# Patient Record
Sex: Female | Born: 1970 | Race: White | Hispanic: No | Smoking: Former smoker
Health system: Southern US, Community
[De-identification: ages and names within clinical notes are randomized; demographics above are authoritative.]

## PROBLEM LIST (undated history)

## (undated) DIAGNOSIS — J45909 Unspecified asthma, uncomplicated: Secondary | ICD-10-CM

## (undated) DIAGNOSIS — N19 Unspecified kidney failure: Secondary | ICD-10-CM

## (undated) DIAGNOSIS — I219 Acute myocardial infarction, unspecified: Secondary | ICD-10-CM

## (undated) DIAGNOSIS — M1990 Unspecified osteoarthritis, unspecified site: Secondary | ICD-10-CM

## (undated) DIAGNOSIS — I10 Essential (primary) hypertension: Secondary | ICD-10-CM

## (undated) DIAGNOSIS — N189 Chronic kidney disease, unspecified: Secondary | ICD-10-CM

## (undated) HISTORY — DX: Unspecified osteoarthritis, unspecified site: M19.90

## (undated) HISTORY — DX: Chronic kidney disease, unspecified: N18.9

## (undated) HISTORY — DX: Unspecified kidney failure: N19

## (undated) HISTORY — DX: Essential (primary) hypertension: I10

## (undated) HISTORY — PX: APPENDECTOMY: SHX54

## (undated) HISTORY — DX: Unspecified asthma, uncomplicated: J45.909

## (undated) HISTORY — PX: RENAL ARTERY STENT: SHX2321

## (undated) HISTORY — PX: ABDOMINAL HYSTERECTOMY: SHX81

## (undated) HISTORY — DX: Acute myocardial infarction, unspecified: I21.9

---

## 2008-05-02 ENCOUNTER — Emergency Department: Payer: Self-pay | Admitting: Emergency Medicine

## 2009-11-14 ENCOUNTER — Ambulatory Visit: Payer: Self-pay | Admitting: Family Medicine

## 2010-01-21 ENCOUNTER — Ambulatory Visit: Payer: Self-pay | Admitting: Nephrology

## 2010-03-12 ENCOUNTER — Ambulatory Visit: Payer: Self-pay | Admitting: Family Medicine

## 2010-03-18 ENCOUNTER — Ambulatory Visit: Payer: Self-pay | Admitting: Vascular Surgery

## 2010-03-20 ENCOUNTER — Ambulatory Visit: Payer: Self-pay | Admitting: Vascular Surgery

## 2010-11-04 ENCOUNTER — Emergency Department (HOSPITAL_COMMUNITY)
Admission: EM | Admit: 2010-11-04 | Discharge: 2010-11-04 | Disposition: A | Discharge status: Home / Self Care | Payer: Medicaid Other | Attending: Emergency Medicine | Admitting: Emergency Medicine

## 2010-11-04 ENCOUNTER — Emergency Department (HOSPITAL_COMMUNITY): Payer: Medicaid Other

## 2010-11-04 DIAGNOSIS — N189 Chronic kidney disease, unspecified: Secondary | ICD-10-CM | POA: Insufficient documentation

## 2010-11-04 DIAGNOSIS — E876 Hypokalemia: Secondary | ICD-10-CM | POA: Insufficient documentation

## 2010-11-04 DIAGNOSIS — I129 Hypertensive chronic kidney disease with stage 1 through stage 4 chronic kidney disease, or unspecified chronic kidney disease: Secondary | ICD-10-CM | POA: Insufficient documentation

## 2010-11-04 DIAGNOSIS — R059 Cough, unspecified: Secondary | ICD-10-CM | POA: Insufficient documentation

## 2010-11-04 DIAGNOSIS — R05 Cough: Secondary | ICD-10-CM | POA: Insufficient documentation

## 2010-11-04 DIAGNOSIS — R0602 Shortness of breath: Secondary | ICD-10-CM | POA: Insufficient documentation

## 2010-11-04 DIAGNOSIS — J45901 Unspecified asthma with (acute) exacerbation: Secondary | ICD-10-CM | POA: Insufficient documentation

## 2010-11-04 LAB — BASIC METABOLIC PANEL
GFR calc Af Amer: >60 mL/min (ref >60)
GFR calc non Af Amer: 57 mL/min — ABNORMAL LOW (ref >60)
Glucose, Bld: 108 mg/dL — ABNORMAL HIGH (ref 70–99)
Potassium: 2.9 mEq/L — ABNORMAL LOW (ref 3.5–5.1)
Sodium: 138 mEq/L (ref 135–145)

## 2010-11-04 LAB — DIFFERENTIAL
Basophils Absolute: 0 10*3/uL (ref 0.0–0.1)
Basophils Relative: 0 % (ref 0–1)
Monocytes Absolute: 0.9 10*3/uL (ref 0.1–1.0)
Neutro Abs: 6.5 10*3/uL (ref 1.7–7.7)
Neutrophils Relative %: 68 % (ref 43–77)

## 2010-11-04 LAB — CBC
Hemoglobin: 12.2 g/dL (ref 12.0–15.0)
MCHC: 33.8 g/dL (ref 30.0–36.0)
WBC: 9.6 10*3/uL (ref 4.0–10.5)

## 2011-03-03 ENCOUNTER — Emergency Department: Payer: Self-pay | Admitting: Unknown Physician Specialty

## 2012-01-22 ENCOUNTER — Observation Stay: Payer: Self-pay | Admitting: Student

## 2012-01-22 LAB — URINALYSIS, COMPLETE
Bilirubin,UR: NEGATIVE
Blood: NEGATIVE
Ketone: NEGATIVE
Ph: 6 (ref 4.5–8.0)
Specific Gravity: 1.013 (ref 1.003–1.030)
Squamous Epithelial: 4

## 2012-01-22 LAB — COMPREHENSIVE METABOLIC PANEL
Albumin: 3.7 g/dL (ref 3.4–5.0)
Alkaline Phosphatase: 94 U/L (ref 50–136)
Anion Gap: 6 — ABNORMAL LOW (ref 7–16)
BUN: 11 mg/dL (ref 7–18)
Glucose: 91 mg/dL (ref 65–99)
Potassium: 3.7 mmol/L (ref 3.5–5.1)
SGOT(AST): 22 U/L (ref 15–37)
SGPT (ALT): 18 U/L (ref 12–78)
Total Protein: 7.3 g/dL (ref 6.4–8.2)

## 2012-01-22 LAB — TROPONIN I: Troponin-I: <0.02 ng/mL

## 2012-01-22 LAB — CBC
Platelet: 214 10*3/uL (ref 150–440)
RDW: 13.9 % (ref 11.5–14.5)

## 2012-03-04 ENCOUNTER — Ambulatory Visit: Payer: Self-pay | Admitting: Vascular Surgery

## 2012-03-04 LAB — CREATININE, SERUM: Creatinine: 1.06 mg/dL (ref 0.60–1.30)

## 2012-06-21 DIAGNOSIS — I10 Essential (primary) hypertension: Secondary | ICD-10-CM | POA: Insufficient documentation

## 2012-06-21 DIAGNOSIS — J45909 Unspecified asthma, uncomplicated: Secondary | ICD-10-CM | POA: Insufficient documentation

## 2012-06-21 DIAGNOSIS — Z01818 Encounter for other preprocedural examination: Secondary | ICD-10-CM | POA: Insufficient documentation

## 2012-11-26 ENCOUNTER — Emergency Department: Payer: Self-pay | Admitting: Emergency Medicine

## 2012-11-26 LAB — CBC
HCT: 39.8 % (ref 35.0–47.0)
HGB: 13.9 g/dL (ref 12.0–16.0)
MCH: 29.5 pg (ref 26.0–34.0)
Platelet: 202 10*3/uL (ref 150–440)
RBC: 4.7 10*6/uL (ref 3.80–5.20)
WBC: 10.6 10*3/uL (ref 3.6–11.0)

## 2012-11-26 LAB — COMPREHENSIVE METABOLIC PANEL
Anion Gap: 7 (ref 7–16)
BUN: 13 mg/dL (ref 7–18)
Bilirubin,Total: 0.6 mg/dL (ref 0.2–1.0)
Co2: 28 mmol/L (ref 21–32)
Creatinine: 1.23 mg/dL (ref 0.60–1.30)
EGFR (African American): >60
Glucose: 107 mg/dL — ABNORMAL HIGH (ref 65–99)
Osmolality: 278 (ref 275–301)
SGOT(AST): 16 U/L (ref 15–37)
SGPT (ALT): 19 U/L (ref 12–78)

## 2012-11-26 LAB — URINALYSIS, COMPLETE
Bilirubin,UR: NEGATIVE
Glucose,UR: NEGATIVE mg/dL (ref 0–75)
Ketone: NEGATIVE
Leukocyte Esterase: NEGATIVE
Ph: 6 (ref 4.5–8.0)
Squamous Epithelial: 21

## 2013-07-17 ENCOUNTER — Emergency Department: Payer: Self-pay | Admitting: Emergency Medicine

## 2013-07-17 LAB — URINALYSIS, COMPLETE
Bilirubin,UR: NEGATIVE
Blood: NEGATIVE
Glucose,UR: NEGATIVE mg/dL (ref 0–75)
Ketone: NEGATIVE
Nitrite: POSITIVE
Ph: 6 (ref 4.5–8.0)
Protein: NEGATIVE
SPECIFIC GRAVITY: 1.014 (ref 1.003–1.030)
Squamous Epithelial: 2

## 2013-07-17 LAB — BASIC METABOLIC PANEL
Anion Gap: 4 — ABNORMAL LOW (ref 7–16)
BUN: 12 mg/dL (ref 7–18)
CALCIUM: 9 mg/dL (ref 8.5–10.1)
CO2: 30 mmol/L (ref 21–32)
Chloride: 106 mmol/L (ref 98–107)
Creatinine: 1.16 mg/dL (ref 0.60–1.30)
EGFR (African American): >60
GFR CALC NON AF AMER: 57 — AB
Glucose: 71 mg/dL (ref 65–99)
Osmolality: 278 (ref 275–301)
POTASSIUM: 3.4 mmol/L — AB (ref 3.5–5.1)
Sodium: 140 mmol/L (ref 136–145)

## 2013-07-17 LAB — CBC
HCT: 39.2 % (ref 35.0–47.0)
HGB: 13.2 g/dL (ref 12.0–16.0)
MCH: 30.3 pg (ref 26.0–34.0)
MCHC: 33.6 g/dL (ref 32.0–36.0)
MCV: 90 fL (ref 80–100)
PLATELETS: 158 10*3/uL (ref 150–440)
RBC: 4.36 10*6/uL (ref 3.80–5.20)
RDW: 12.9 % (ref 11.5–14.5)
WBC: 7.8 10*3/uL (ref 3.6–11.0)

## 2013-07-19 LAB — URINE CULTURE

## 2014-06-27 NOTE — Discharge Summary (Signed)
PATIENT NAME:  Jacqueline Parks, Sharron A MR#:  [id number] DATE OF BIRTH:  October 12, 1970  DATE OF ADMISSION:  01/22/2012 DATE OF DISCHARGE:  01/23/2012  PRIMARY CARE PHYSICIAN: Ulanda EdisonEmma Williams, MD   CHIEF COMPLAINT: Headache.   DISCHARGE DIAGNOSES:  1. Malignant hypertension. 2. History of occluded left renal artery stenosis and 30 to 35% right renal artery stenosis per bilateral renal artery angiogram done in January 2012.  3. Asthma, which is controlled.   DISCHARGE MEDICATIONS:  1. Coreg 25 mg 2 times a day.  2. Amlodipine 10 mg daily.  3. Hydrochlorothiazide/lisinopril 12.5 mg/20 mg 2 tabs once a day.   DISPOSITION: Home.   DIET: Low sodium.   ACTIVITY: As tolerated.   FOLLOW-UP:  1. Please follow with your primary care physician early next week for a blood pressure check.  2. Please follow with Dr. Wyn Quakerew in about two weeks for further evaluation of your renal artery stenosis. 3. If you have any further symptoms including headache, blurry vision, or any other complaints, call your primary care physician and/or 9-1-1 right away.   DISPOSITION: Home.   HISTORY OF PRESENT ILLNESS AND HOSPITAL COURSE: For full details of history and physical, please see the dictation on 01/22/2012 by Dr. Imogene Burnhen. Briefly, this is a 44 year old Caucasian female who presented with the above chief complaint. The patient stated that she had not missed any of her blood pressure medications, however, was noted to have blood pressure of 245/100 and, therefore, she presented to the hospital for the above chief complaint. She was admitted for observation on telemetry. Initial blood pressures here were 189/97 but did peak at systolic 219. She did get her outpatient medications and her blood pressure has been normalized. It has been stable overall. She had a CAT scan of the head which was negative for acute events. I discussed the case with Dr. Wyn Quakerew. The patient apparently denies following up with Dr. Wyn Quakerew after the angiogram done in  January 2012. I have strongly recommended following with Dr. Wyn Quakerew to see if there is any progression of disease in the right renal artery as the patient has a nonfunctional kidney and occluded left renal artery. Dr. Wyn Quakerew wants to see the patient in the office in about two weeks and an appointment will be made for her. At this point as her symptoms have resolved and pressures are controlled, she will be discharged with outpatient follow-up.   TOTAL TIME SPENT: 35 minutes.   CODE STATUS: The patient is FULL CODE.  ____________________________ Krystal EatonShayiq Garlen Reinig, MD sa:drc D: 01/23/2012 09:45:37 ET T: 01/23/2012 11:00:05 ET JOB#: [id number]  cc: Krystal EatonShayiq Hayleigh Bawa, MD, <Dictator> Lucillie GarfinkelEmma R. Mayford KnifeWilliams, MD Annice NeedyJason S. Dew, MD Marcelle SmilingSHAYIQ Memorial HospitalHMADZIA MD ELECTRONICALLY SIGNED 02/19/2012 11:03

## 2014-06-27 NOTE — H&P (Signed)
PATIENT NAME:  Jacqueline Parks, Jacqueline Parks MR#:  [id number] DATE OF BIRTH:  04/28/1970  DATE OF ADMISSION:  01/22/2012  PRIMARY CARE PHYSICIAN: Dr. Ulanda EdisonEmma Williams   REFERRING PHYSICIAN: Dr. Enedina FinnerGoli   CHIEF COMPLAINT: Headache today.   HISTORY OF PRESENT ILLNESS: The patient is Parks 44 year old Caucasian female with Parks history of hypertension and asthma who presented to the ED with Parks headache today and high blood pressure. The patient took her blood pressure medication this morning but developed Parks headache this afternoon and also she has blurred vision. The patient measured her blood pressure which was 245/100 so she came to the ED for further evaluation. 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   PAST MEDICAL HISTORY:  1. Hypertension. 2. Asthma.   SOCIAL HISTORY: Denies any smoking, drinking, or illicit drugs.   FAMILY HISTORY: Hypertension and also heart disease in her family. Father had Parks heart attack. Brother has heart disease, CAD, has multiple stents placed.   PAST SURGICAL HISTORY: Hysterectomy.   ALLERGIES: Proventil.   HOME MEDICATIONS:  1. HCTZ/lisinopril 12.5 mg/20 mg p.o. 2 tablets p.o. daily.  2. Coreg 25 mg p.o. b.i.d.  3. Amlodipine 10 mg p.o. daily.   REVIEW OF SYSTEMS: CONSTITUTIONAL: The patient denies any fever or chills but has Parks headache. No dizziness or weakness. Has weight loss of about 100 pounds. EYES: Has double vision and blurry vision. ENT: No epistaxis, postnasal drip, or slurred speech. CARDIOVASCULAR: No chest pain, palpitation, orthopnea, or nocturnal dyspnea. No leg edema. PULMONARY: No cough, sputum, shortness of breath, or hemoptysis. GI: No abdominal pain, nausea, vomiting, or diarrhea. No melena or bloody stool. GU: No dysuria, hematuria, or incontinence. SKIN: No rash or jaundice. NEUROLOGY: No syncope, loss of consciousness, or seizure.   PHYSICAL EXAMINATION:   VITAL SIGNS: Temperature 97.8,  blood pressure was 200/95 but now decreased to 153/86 after hydralazine and clonidine treatment.   GENERAL: The patient is alert, awake, oriented in no acute distress.   HEENT: Pupils are round, equal, reactive to light and accommodation. Moist oral mucosa. Clear oropharynx.   NECK: Supple. No JVD or carotid bruits. No lymphadenopathy. No thyromegaly.   CARDIOVASCULAR: S1, S2 regular rate, rhythm. No murmurs or gallops.   PULMONARY: Bilateral air entry. No wheezing or rales.   ABDOMEN: Soft, obese. No distention or tenderness. No organomegaly. Bowel sounds present.   EXTREMITIES: No edema, clubbing, or cyanosis. No calf tenderness. Strong bilateral pedal bodies.   SKIN: No rash or jaundice.   NEUROLOGY: Alert and oriented x3. No focal deficit. Power 5 out of 5. Sensation intact. Deep tendon reflexes 2+.    LABORATORY DATA: CBC normal with glucose 91, BUN 11, creatinine 1.0. Electrolytes normal. Troponin less than 0.02. Urinalysis negative. INR 0.9.   CAT scan of head no acute abnormality.    IMPRESSION:  1. Hypertensive malignancy.  2. Obesity.  3. Asthma.   PLAN OF TREATMENT:  1. The patient will be placed for observation with telemonitor.  2. We will give hydralazine IV p.r.n. and continue Coreg and amlodipine.  3. GI and DVT prophylaxis.   Discussed the patient's situation and plan of treatment with the patient and the patient's husband.   TIME SPENT: About 50 minutes.   ____________________________ Shaune PollackQing Avabella Wailes, MD qc:drc D: 01/22/2012 19:57:07 ET T: 01/23/2012 06:46:37 ET JOB#: [id number]  cc: Shaune PollackQing Demetri Goshert, MD, <Dictator> Lucillie GarfinkelEmma R. Mayford KnifeWilliams, MD Shaune PollackQING Zahlia Deshazer MD ELECTRONICALLY SIGNED  01/25/2012 3:25 

## 2014-06-27 NOTE — Op Note (Signed)
PATIENT NAME:  Jacqueline Parks, Jacqueline B MR#:  [id number] DATE OF BIRTH:  February 15, 1971  DATE OF PROCEDURE:  03/04/2012  PREOPERATIVE DIAGNOSES: 1. Right renal artery stenosis.  2. Left renal artery occlusion.  3. Severe poorly controlled hypertension, on multiple medications.   POSTOPERATIVE DIAGNOSES:  1. Right renal artery stenosis.  2. Left renal artery occlusion.  3. Severe poorly controlled hypertension, on multiple medications.   PROCEDURES PERFORMED: 1. Ultrasound guidance for vascular access, right femoral artery.  2. Catheter placement into right adrenal artery from right femoral approach.  3. Aortogram and selective right renal angiogram.  4. Balloon expandable stent placement, 6.5 mm diameter x 18 mm length, right renal artery, with the use of the Medtronic guard wire device.  5. StarClose closure device, right femoral artery.   SURGEON: Annice NeedyJason S. Kevontae Burgoon, M.D.   ANESTHESIA: Local with moderate conscious sedation.   ESTIMATED BLOOD LOSS: Approximately 50 mL.  CONTRAST USED: 55 mL Visipaque.   INDICATION FOR PROCEDURE: The patient is a 44 year old white female with a long history of renal artery issues. She was evaluated several years ago and was known to have a left renal artery occlusion. At that time, her right renal artery stenosis was less than 50% and it was not hemodynamically significant. However, she presents with worsening hypertension and a duplex shows progression of velocities of her right renal artery consistent with hemodynamically significant right renal artery stenosis. For these reasons, she is brought in for angiogram for further evaluation and potential treatment. The risks and benefits were discussed and informed consent was obtained.   DESCRIPTION OF PROCEDURE: The patient is brought to the vascular interventional radiology suite, groins were shaved and prepped and a sterile surgical field was created. Due to body habitus, ultrasound was used to access the right femoral  artery, which was patent and normal under ultrasound. This was done without difficulty with a Seldinger needle and permanent image was recorded. A 5 French sheath was placed. A pigtail catheter was placed in the aorta at the L1 level and AP aortogram was performed. This showed a known left renal artery occlusion and what appeared to be an approximately 80% stenosis of the right renal artery. This had progressed significantly from her evaluation several years ago and now was in a high-grade range and would benefit from treatment. The patient was given 4000 units of intravenous heparin for systemic anticoagulation as well as 12.5 grams of mannitol. A 6 French sheath was then placed and a 6 IM guide catheter was used to cannulate the right renal artery without difficulty. This was done and the lesion was easily crossed with the Medtronic guard wire. This inflated to 6 mm. The vessel was a generous size and a 6.5 mm diameter x 18 mm length balloon expandable stent was selected. This was brought across the lesion encompassing it and then going slightly back into the aorta with a tight waste taken on inflation which resolved with stent deployment. The aspiration catheter was used to aspirate 2 syringes of blood and then it was flushed copiously with heparin and saline. The balloon was then deflated. Total inflation time was approximately 5 minutes. Completion angiogram was performed through the IM guide catheter and showed the stent to be widely patent with an excellent nephrogram and markedly improved flow and at this point I elected to terminate the procedure. The guide catheter was removed. A StarClose closure device was deployed in the usual fashion with excellent hemostatic result. The patient tolerated the procedure well  and was taken to the recovery room in stable condition.  ____________________________ Annice Needy, MD jsd:sb D: 03/04/2012 09:00:50 ET T: 03/04/2012 09:30:14 ET JOB#: [id number]  cc: Annice Needy, MD, <Dictator> Lucillie Garfinkel. Mayford Knife, MD Annice Needy MD ELECTRONICALLY SIGNED 03/04/2012 16:23

## 2014-09-15 IMAGING — CT CT HEAD WITHOUT CONTRAST
1 series · 16 of 30 positions shown, 20 images · non-contrast
Comparison: none

REASON FOR EXAM: ha
COMMENTS:

PROCEDURE:     CT  - CT HEAD WITHOUT CONTRAST  - January 22, 2012  [DATE]
RESULT:     Comparison:  None
TECHNIQUE: Multiple axial images from the foramen magnum to the vertex were
obtained without IV contrast.

[Series 2: soft tissue · axial · 0.39mm/px · z∈[-165,-25]mm · 16 of 32 slices shown, 20 images]
[im 2/32  brain]
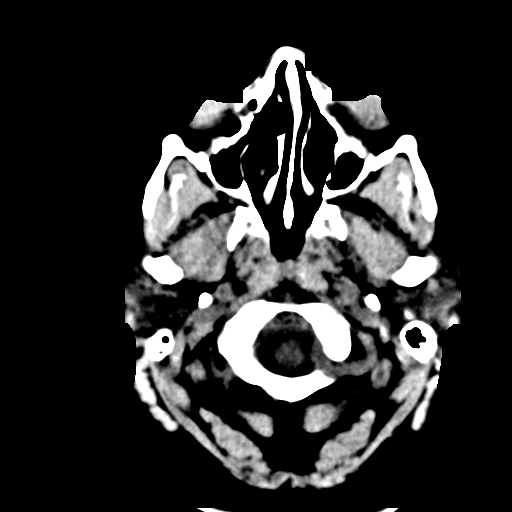
[im 2/32  bone]
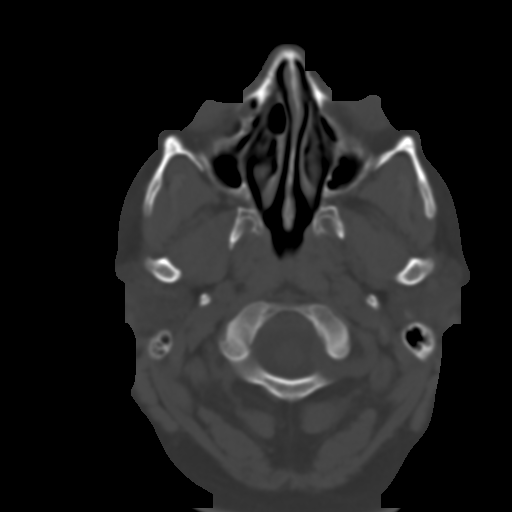
[im 4/32  brain]
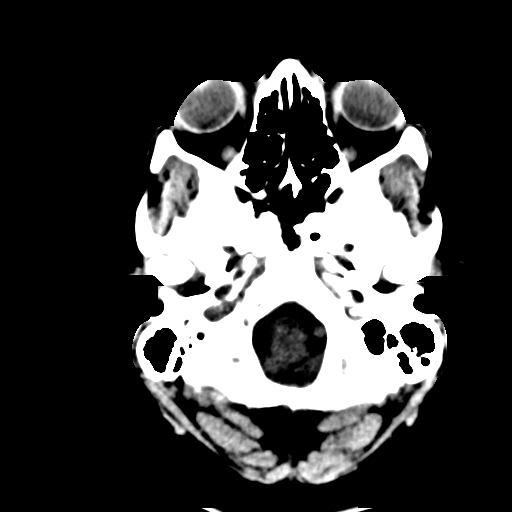
[im 6/32  brain]
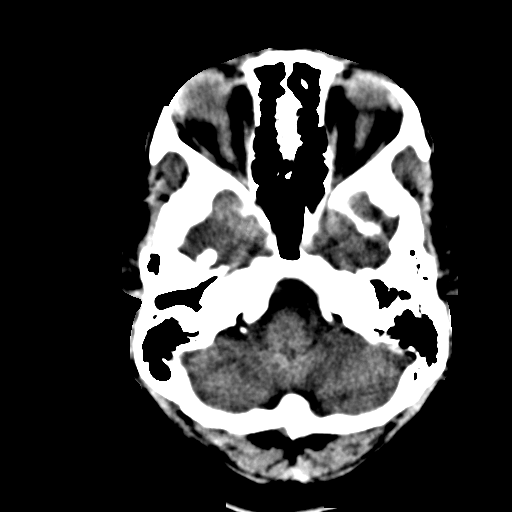
[im 8/32  brain]
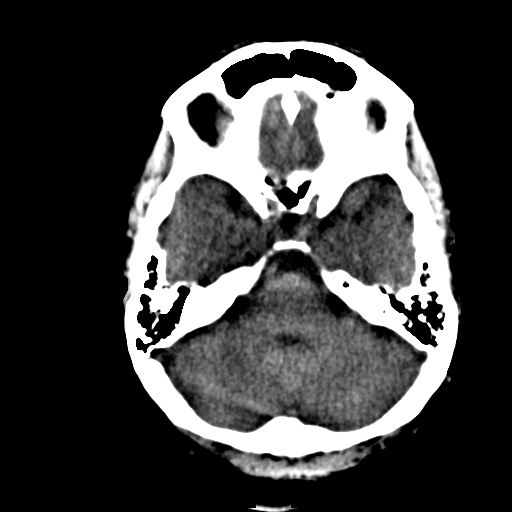
[im 9/32  brain]
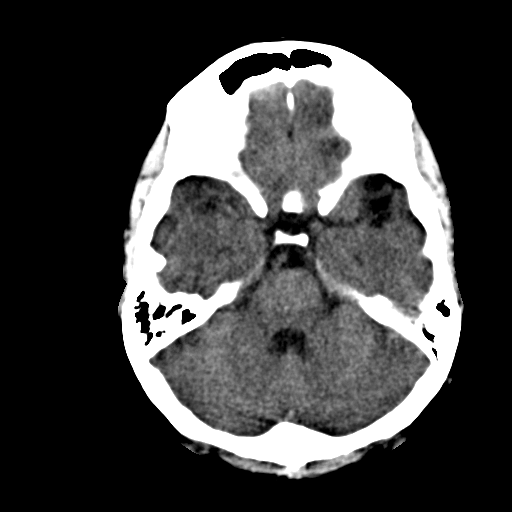
[im 9/32  bone]
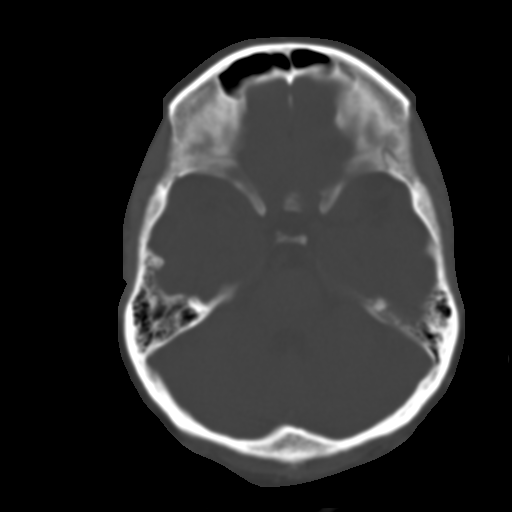
[im 11/32  brain]
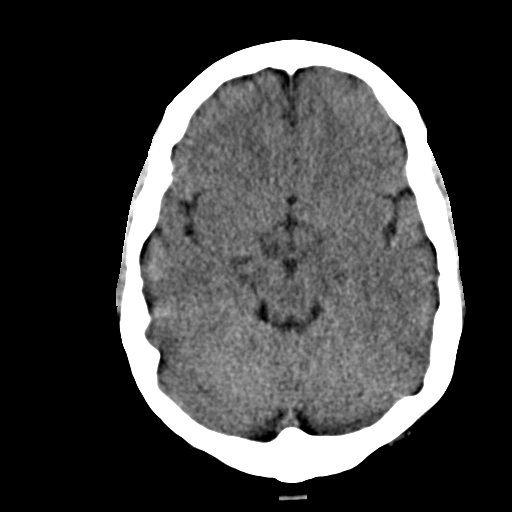
[im 13/32  brain]
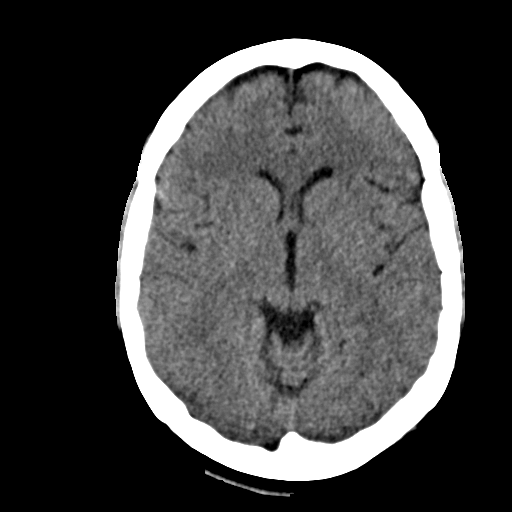
[im 15/32  brain]
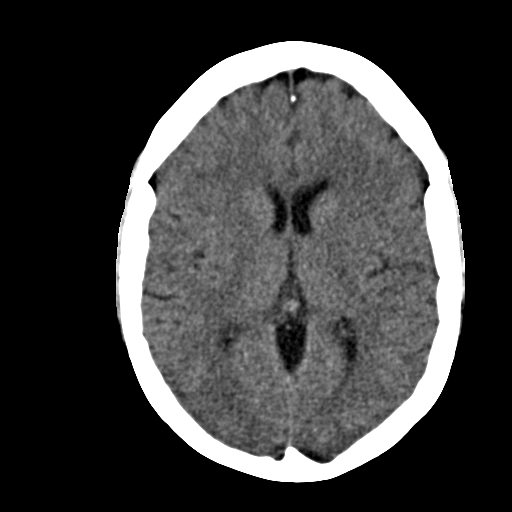
[im 17/32  brain]
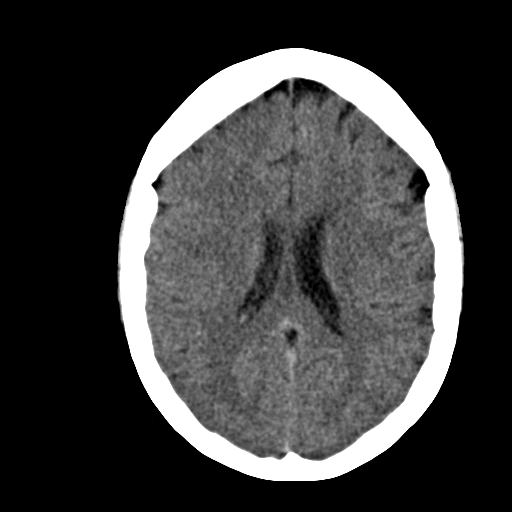
[im 17/32  bone]
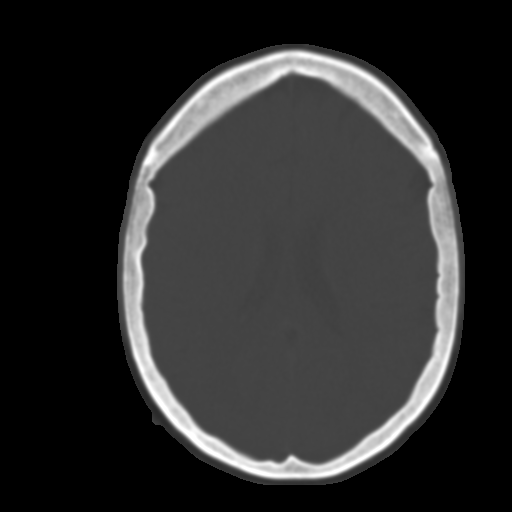
[im 19/32  brain]
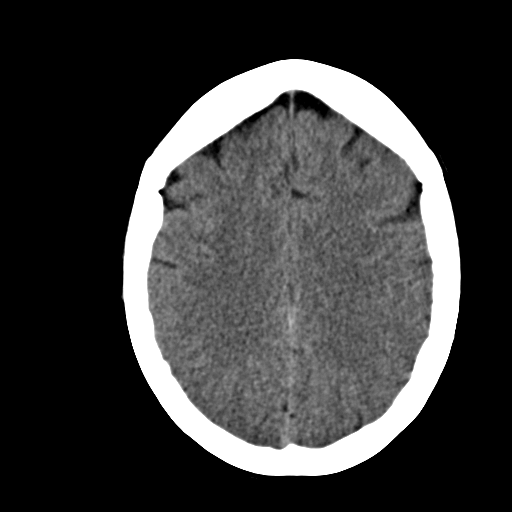
[im 21/32  brain]
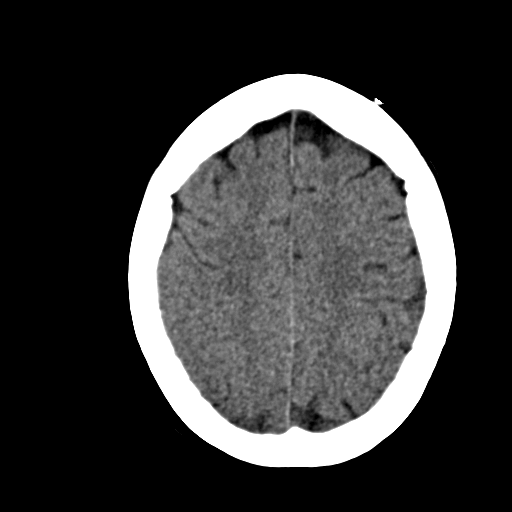
[im 23/32  brain]
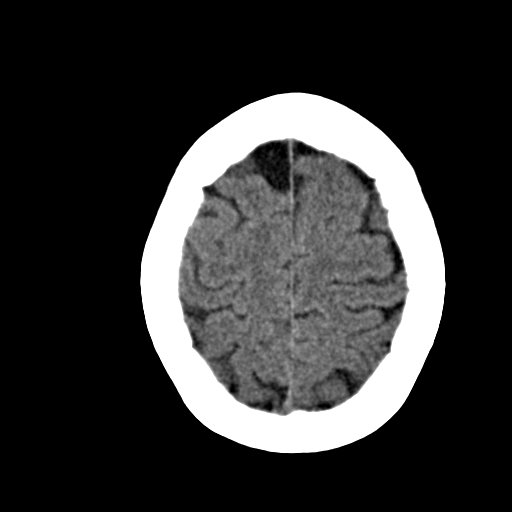
[im 24/32  brain]
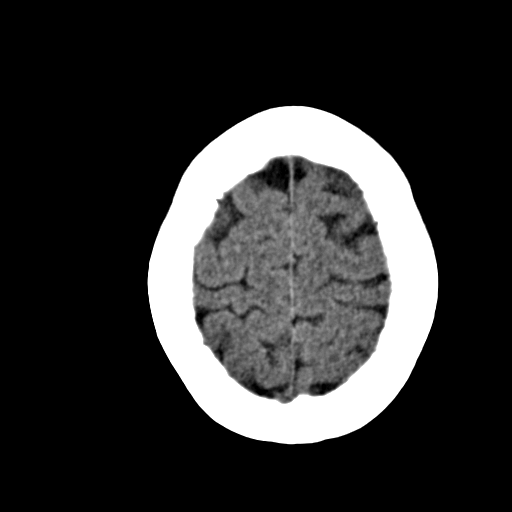
[im 24/32  bone]
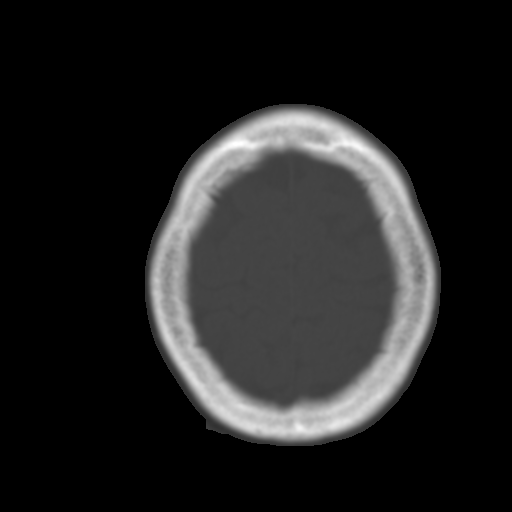
[im 26/32  brain]
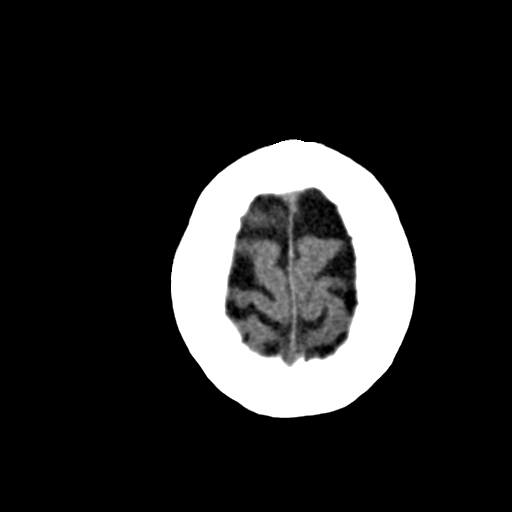
[im 28/32  brain]
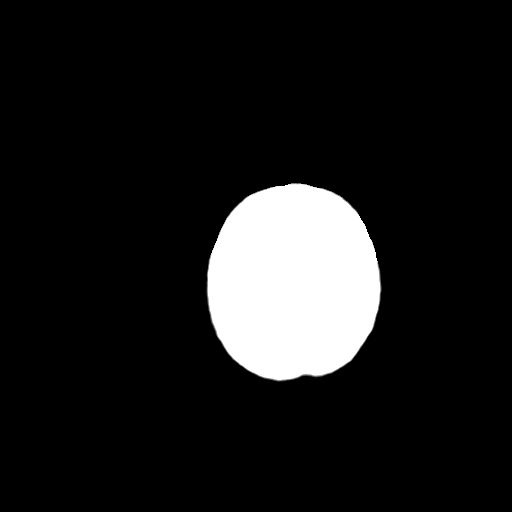
[im 30/32  brain]
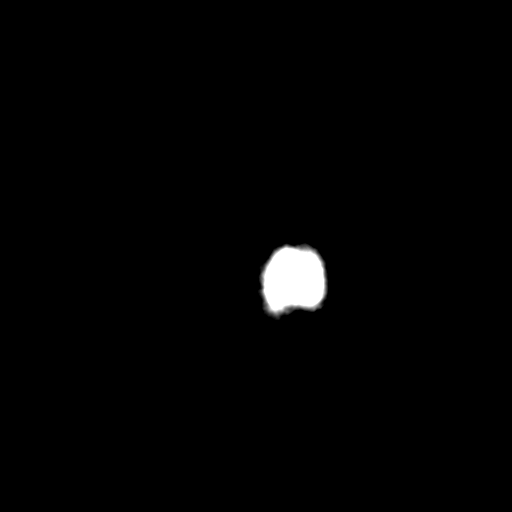

[16 of 30 positions shown; findings below may reference images not displayed]

FINDINGS: There is no evidence of mass effect, midline shift, or extra-axial fluid
collections.  There is no evidence of a space-occupying lesion or
intracranial hemorrhage. There is no evidence of a cortical-based area of
acute infarction.

The ventricles and sulci are appropriate for the patient's age. The basal
cisterns are patent.

Visualized portions of the orbits are unremarkable. The visualized portions
of the paranasal sinuses and mastoid air cells are unremarkable.

The osseous structures are unremarkable. There is a radiopaque foreign body
in the left frontal scalp.
IMPRESSION: No acute intracranial process.

[REDACTED]

## 2016-02-05 ENCOUNTER — Encounter (INDEPENDENT_AMBULATORY_CARE_PROVIDER_SITE_OTHER): Payer: Self-pay | Admitting: Vascular Surgery

## 2016-02-05 ENCOUNTER — Ambulatory Visit (INDEPENDENT_AMBULATORY_CARE_PROVIDER_SITE_OTHER): Payer: Medicaid Other | Admitting: Vascular Surgery

## 2016-02-05 VITALS — BP 118/80 | HR 72 | Resp 16 | Ht 64.0 in | Wt 346.0 lb

## 2016-02-05 DIAGNOSIS — I150 Renovascular hypertension: Secondary | ICD-10-CM | POA: Insufficient documentation

## 2016-02-05 DIAGNOSIS — N183 Chronic kidney disease, stage 3 unspecified: Secondary | ICD-10-CM

## 2016-02-05 DIAGNOSIS — I701 Atherosclerosis of renal artery: Secondary | ICD-10-CM | POA: Diagnosis not present

## 2016-02-05 NOTE — Assessment & Plan Note (Signed)
This is a significant decline in her GFR from 2 years ago when it was approximately 5667. It is now down to 47. This is a very worrisome problem given her solitary kidney with a previous intervention.

## 2016-02-05 NOTE — Progress Notes (Signed)
MRN : [id number]  Jacqueline Parks is a 45 y.o. (11/10/1970) female who presents with chief complaint of  Chief Complaint  Patient presents with  . Follow-up  .  History of Present Illness: Patient returns today in follow up of Her blood pressure control, renal function, and renal artery stenosis. She is about 4 years status post right renal artery stent placement for a solitary kidney. For several years this markedly improved her renal function and her blood pressure control. Over the past several months, both have gotten markedly worse and her GFR is now down to 47. She is also required an increase in her blood pressure regimen. She remains active. She has not had a noninvasive study of her renal arteries recently. I last saw her in 2015 when she moved away. She was seen by vascular surgeon at another institution but was very displeased with their assessment and returns for further evaluation by me.  Current Outpatient Prescriptions  Medication Sig Dispense Refill  . albuterol (PROVENTIL HFA;VENTOLIN HFA) 108 (90 Base) MCG/ACT inhaler Inhale into the lungs.    Marland Kitchen. amLODipine (NORVASC) 10 MG tablet Take 10 mg by mouth.    . carvedilol (COREG) 25 MG tablet Take 25 mg by mouth.    . Fluticasone-Salmeterol (ADVAIR) 250-50 MCG/DOSE AEPB Inhale 1 puff into the lungs 2 (two) times daily.    Marland Kitchen. lisinopril (PRINIVIL,ZESTRIL) 40 MG tablet Take 40 mg by mouth daily.    Marland Kitchen. lisinopril-hydrochlorothiazide (PRINZIDE,ZESTORETIC) 10-12.5 MG tablet Take by mouth.    . Loratadine 10 MG CAPS Take by mouth.    . spironolactone (ALDACTONE) 100 MG tablet Take 100 mg by mouth daily.     No current facility-administered medications for this visit.     Past Medical History:  Diagnosis Date  . Arthritis   . Asthma   . Chronic kidney disease   . Hypertension   . Kidney failure     Past Surgical History:  Procedure Laterality Date  . ABDOMINAL HYSTERECTOMY    . APPENDECTOMY    . CESAREAN SECTION    . RENAL  ARTERY STENT Right     Social History Social History  Substance Use Topics  . Smoking status: Former Games developermoker  . Smokeless tobacco: Never Used  . Alcohol use No  Married with 2 teenage children  Family History Family History  Problem Relation Age of Onset  . COPD Mother   . Heart disease Father    No bleeding disorders or clotting disorders  Allergies  Allergen Reactions  . Albuterol Sulfate Palpitations     REVIEW OF SYSTEMS (Negative unless checked)  Constitutional: [] Weight loss  [] Fever  [] Chills Cardiac: [] Chest pain   [] Chest pressure   [] Palpitations   [] Shortness of breath when laying flat   [] Shortness of breath at rest   [] Shortness of breath with exertion. Vascular:  [] Pain in legs with walking   [] Pain in legs at rest   [] Pain in legs when laying flat   [] Claudication   [] Pain in feet when walking  [] Pain in feet at rest  [] Pain in feet when laying flat   [] History of DVT   [] Phlebitis   [x] Swelling in legs   [] Varicose veins   [] Non-healing ulcers Pulmonary:   [] Uses home oxygen   [] Productive cough   [] Hemoptysis   [] Wheeze  [] COPD   [] Asthma Neurologic:  [] Dizziness  [] Blackouts   [] Seizures   [] History of stroke   [] History of TIA  [] Aphasia   [] Temporary blindness   []   Dysphagia   [] Weakness or numbness in arms   [] Weakness or numbness in legs Musculoskeletal:  [] Arthritis   [] Joint swelling   [] Joint pain   [] Low back pain Hematologic:  [] Easy bruising  [] Easy bleeding   [] Hypercoagulable state   [] Anemic   Gastrointestinal:  [] Blood in stool   [] Vomiting blood  [] Gastroesophageal reflux/heartburn   [] Abdominal pain Genitourinary:  [] Chronic kidney disease   [] Difficult urination  [] Frequent urination  [] Burning with urination   [] Hematuria Skin:  [] Rashes   [] Ulcers   [] Wounds Psychological:  [] History of anxiety   []  History of major depression.  Physical Examination  BP 118/80   Pulse 72   Resp 16   Ht 5\' 4"  (1.626 m)   Wt (!) 346 lb (156.9 kg)   BMI  59.39 kg/m  Gen:  WD/WN, NAD. obese Head: Lee/AT, No temporalis wasting. Ear/Nose/Throat: Hearing grossly intact, nares w/o erythema or drainage, trachea midline Eyes: Conjunctiva clear. Sclera non-icteric Neck: Supple.  No JVD.  Pulmonary:  Good air movement, no use of accessory muscles.  Cardiac: RRR, normal S1, S2 Vascular:  Vessel Right Left  Radial Palpable Palpable  Ulnar Palpable Palpable  Brachial Palpable Palpable  Carotid Palpable, without bruit Palpable, without bruit  Aorta Not palpable N/A  Femoral Palpable Palpable  Popliteal Palpable Palpable  PT Palpable Palpable  DP Palpable Palpable   Gastrointestinal: soft, non-tender/non-distended. No guarding/reflex.  Musculoskeletal: M/S 5/5 throughout.  No deformity or atrophy. 1+ BLE edema. Neurologic: Sensation grossly intact in extremities.  Symmetrical.  Speech is fluent.  Psychiatric: Judgment intact, Mood & affect appropriate for pt's clinical situation. Dermatologic: No rashes or ulcers noted.  No cellulitis or open wounds. Lymph : No Cervical, Axillary, or Inguinal lymphadenopathy.      Labs No results found for this or any previous visit (from the past 2160 hour(s)).  Radiology No results found.   Assessment/Plan  CKD (chronic kidney disease) stage 3, GFR 30-59 ml/min This is a significant decline in her GFR from 2 years ago when it was approximately 67. It is now down to 47. This is a very worrisome problem given her solitary kidney with a previous intervention.  Renovascular hypertension Her control is again significantly worsened and she is now taking 4 different medications for blood pressure including a combination agent. She reports marked worsening over the past several months. This, in addition to her renal decline makes it very suspicious that her renal artery stent to her solitary kidney and has developed restenosis. At this point, I think she needs an angiogram for full evaluation and potential  treatment. Should her stent be patent, this may actually be a worse issue and would likely lead to continued renal decline. A recurrent stenosis would at least be a treatable issue, and we will get this angiogram arranged for the next week. This is a very severe and worrisome issue with a high risk of progression to renal failure.  Renal artery stenosis (HCC) She is now 4 years status post right renal artery stent placement for high-grade stenosis with renovascular hypertension and a solitary kidney. She did well for several years. She has now developed worsening hypertension over the past several months. This in addition to her renal decline makes it very suspicious that her renal artery stent to her solitary kidney and has developed restenosis. At this point, I think she needs an angiogram for full evaluation and potential treatment. Should her stent be patent, this may actually be a worse issue and would  likely lead to continued renal decline. A recurrent stenosis would at least be a treatable issue, and we will get this angiogram arranged for the next week. This is a very severe and worrisome issue with a high risk of progression to renal failure.    Festus BarrenJason Arsenia Goracke, MD  02/05/2016 11:19 AM    This note was created with Dragon medical transcription system.  Any errors from dictation are purely unintentional

## 2016-02-05 NOTE — Assessment & Plan Note (Signed)
Her control is again significantly worsened and she is now taking 4 different medications for blood pressure including a combination agent. She reports marked worsening over the past several months. This, in addition to her renal decline makes it very suspicious that her renal artery stent to her solitary kidney and has developed restenosis. At this point, I think she needs an angiogram for full evaluation and potential treatment. Should her stent be patent, this may actually be a worse issue and would likely lead to continued renal decline. A recurrent stenosis would at least be a treatable issue, and we will get this angiogram arranged for the next week. This is a very severe and worrisome issue with a high risk of progression to renal failure.

## 2016-02-05 NOTE — Assessment & Plan Note (Signed)
She is now 4 years status post right renal artery stent placement for high-grade stenosis with renovascular hypertension and a solitary kidney. She did well for several years. She has now developed worsening hypertension over the past several months. This in addition to her renal decline makes it very suspicious that her renal artery stent to her solitary kidney and has developed restenosis. At this point, I think she needs an angiogram for full evaluation and potential treatment. Should her stent be patent, this may actually be a worse issue and would likely lead to continued renal decline. A recurrent stenosis would at least be a treatable issue, and we will get this angiogram arranged for the next week. This is a very severe and worrisome issue with a high risk of progression to renal failure.

## 2016-02-05 NOTE — Patient Instructions (Signed)
Renal Artery Stenosis Introduction Renal artery stenosis (RAS) is narrowing of the artery that carries blood to your kidneys. It can affect one or both kidneys. Your kidneys filter waste and extra fluid from your blood. You get rid of the waste and fluid when you urinate. Your kidneys also make an important chemical messenger (hormone) called renin. Renin helps regulate your blood pressure. The first sign of RAS may be high blood pressure. Over time, other symptoms can develop. What are the causes? Plaque buildup in your arteries (atherosclerosis) is the main cause of RAS. The plaques that cause this are made up of:  Fat.  Cholesterol.  Calcium.  Other substances. As these substances build up in your renal artery, this slows the blood supply to your kidneys. The lack of blood and oxygen causes the signs and symptoms of RAS. A much less common cause of RAS is a disease called fibromuscular dysplasia. This disease causes abnormal cell growth that narrows the renal artery. It is not related to atherosclerosis. It occurs mostly in women who are 25-50 years old. It may be passed down through families. What increases the risk? You may be at risk for renal artery stenosis if you:  Are a man who is at least 47 years old.  Are a woman who is at least 47 years old.  Have high blood pressure.  Have high cholesterol.  Are a smoker.  Abuse alcohol.  Have diabetes or prediabetes.  Are overweight.  Have a family history of early heart disease. What are the signs or symptoms? RAS usually develops slowly. You may not have any signs or symptoms at first. The earliest signs may be:  Developing high blood pressure.  A sudden increase in existing high blood pressure.  No longer responding to medicine that used to control your blood pressure. Later signs and symptoms are due to kidney damage. They may include:  Fatigue.  Shortness of breath.  Swollen legs and feet.  Dry  skin.  Headaches.  Muscle cramps.  Loss of appetite.  Nausea or vomiting. How is this diagnosed? Your health care provider may suspect RAS based on changes in your blood pressure and your risk factors. A physical exam will be done. Your health care provider may use a stethoscope to listen for a whooshing sound (bruit) that can occur where the renal artery is blocking blood flow. Several tests may be done to confirm a diagnosis of RAS. These may include:  Blood and urine tests to check your kidney function.  Imaging tests of your kidneys, such as:  A test that involves using sound waves to create an image of your kidneys and the blood flow to your kidneys (ultrasound).  A test in which dye is injected into one of your blood vessels so images can be taken as the dye flows through your renal arteries (angiogram). These tests can be done using X-rays, a CT scan (computed tomography angiogram, CTA), or a type of MRI (magnetic resonance angiogram, MRA). How is this treated? Making lifestyle changes to reduce your risk factors is the first treatment option for early RAS. If the blood flow to one of your kidneys is cut by more than half, you may need medicine to:  Lower your blood pressure. This is the main medical treatment for RAS. You may need more than one type of medicine for this. The two types that work best for RAS are:  ACE inhibitors.  Angiotensin receptor blockers.  Reduce fluid in the body (diuretics).    Lower your cholesterol (statins). If medicine is not enough to control RAS, you may need surgery. This may involve:  Threading a tube with an inflatable balloon into the renal artery to force it open (angioplasty).  Removing plaque from inside the artery (endarterectomy). Follow these instructions at home:  Take medicines only as directed by your health care provider.  Make any lifestyle changes recommended by your health care provider. This may include:  Working with a  dietitian to maintain a heart-healthy diet. This type of diet is low in saturated fat, salt, and added sugar.  Starting an exercise program as directed by your health care provider.  Maintaining a healthy weight.  Quitting smoking.  Not abusing alcohol.  Keep all follow-up visits as directed by your health care provider. This is important. Contact a health care provider if:  Your symptoms of RAS are not getting better.  Your symptoms are changing or getting worse. Get help right away if:  You have very bad pain in your back or abdomen.  You have blood in your urine. This information is not intended to replace advice given to you by your health care provider. Make sure you discuss any questions you have with your health care provider. Document Released: 11/20/2004 Document Revised: 08/02/2015 Document Reviewed: 06/09/2013  2017 Elsevier  

## 2016-02-06 ENCOUNTER — Other Ambulatory Visit (INDEPENDENT_AMBULATORY_CARE_PROVIDER_SITE_OTHER): Payer: Self-pay | Admitting: Vascular Surgery

## 2016-02-07 ENCOUNTER — Encounter
Admission: RE | Admit: 2016-02-07 | Discharge: 2016-02-07 | Disposition: A | Discharge status: Home / Self Care | Payer: Medicaid Other | Source: Ambulatory Visit | Attending: Vascular Surgery | Admitting: Vascular Surgery

## 2016-02-07 MED ORDER — DEXTROSE 5 % IV SOLN
1.5000 g | INTRAVENOUS | Status: AC
  Administered 2016-02-08: 1.5 g via INTRAVENOUS

## 2016-02-07 NOTE — Patient Instructions (Signed)
[facility]  Your procedure is scheduled with Dr                               On:  Date: 02/08/2016                   Time:11:00 am     On arrival [facility] Your arrival time will be approximately 1-1.5 hours prior to you procedure start time. This allows time to check lab results and prep you for the procedure.  If your procedure time change you will be notified by the doctor's office.    _x____Do not eat or drink 8 hours prior to your procedure.    __x___Take all your morning medications with sips of water.  _____Continue Plavix and/or Aspirin.  _____Take half of your bedtime Insulin.  _____Take half of  your morning Insulin.  __x___Bring your current medications in their bottle with you.  _____Do not take Metformin 24 hours before procedure or 48 hours after you procedure.  _____Stop Pradaxa, Xarelto, Coumadin, Effient, or Eliquis for 3 days prior to your procedure.  Your recovery time will be 1-2 hours (1 hour for a  vein stick, 2 hours for an artery stick )  Please call Dr Wyn Quakerew and Dr Marijean HeathSchnier's office with any questions or concerns: [phone].  You will need to have someone drive you home and stay with you the night of the procedure.

## 2016-02-08 ENCOUNTER — Encounter: Payer: Self-pay | Admitting: *Deleted

## 2016-02-08 ENCOUNTER — Encounter
Admission: RE | Disposition: A | Discharge status: Home / Self Care | Payer: Medicaid Other | Source: Ambulatory Visit | Attending: Vascular Surgery

## 2016-02-08 ENCOUNTER — Ambulatory Visit
Admission: RE | Admit: 2016-02-08 | Discharge: 2016-02-08 | Disposition: A | Discharge status: Home / Self Care | Payer: Medicaid Other | Source: Ambulatory Visit | Attending: Vascular Surgery | Admitting: Vascular Surgery

## 2016-02-08 DIAGNOSIS — Y832 Surgical operation with anastomosis, bypass or graft as the cause of abnormal reaction of the patient, or of later complication, without mention of misadventure at the time of the procedure: Secondary | ICD-10-CM | POA: Diagnosis not present

## 2016-02-08 DIAGNOSIS — Z87891 Personal history of nicotine dependence: Secondary | ICD-10-CM | POA: Diagnosis not present

## 2016-02-08 DIAGNOSIS — Z90710 Acquired absence of both cervix and uterus: Secondary | ICD-10-CM | POA: Diagnosis not present

## 2016-02-08 DIAGNOSIS — N183 Chronic kidney disease, stage 3 (moderate): Secondary | ICD-10-CM | POA: Diagnosis not present

## 2016-02-08 DIAGNOSIS — Z888 Allergy status to other drugs, medicaments and biological substances status: Secondary | ICD-10-CM | POA: Diagnosis not present

## 2016-02-08 DIAGNOSIS — I150 Renovascular hypertension: Secondary | ICD-10-CM | POA: Diagnosis not present

## 2016-02-08 DIAGNOSIS — T82858A Stenosis of vascular prosthetic devices, implants and grafts, initial encounter: Secondary | ICD-10-CM | POA: Insufficient documentation

## 2016-02-08 DIAGNOSIS — N179 Acute kidney failure, unspecified: Secondary | ICD-10-CM | POA: Insufficient documentation

## 2016-02-08 DIAGNOSIS — Z836 Family history of other diseases of the respiratory system: Secondary | ICD-10-CM | POA: Diagnosis not present

## 2016-02-08 DIAGNOSIS — Z8249 Family history of ischemic heart disease and other diseases of the circulatory system: Secondary | ICD-10-CM | POA: Diagnosis not present

## 2016-02-08 DIAGNOSIS — I701 Atherosclerosis of renal artery: Secondary | ICD-10-CM | POA: Insufficient documentation

## 2016-02-08 DIAGNOSIS — I129 Hypertensive chronic kidney disease with stage 1 through stage 4 chronic kidney disease, or unspecified chronic kidney disease: Secondary | ICD-10-CM | POA: Diagnosis not present

## 2016-02-08 HISTORY — PX: PERIPHERAL VASCULAR CATHETERIZATION: SHX172C

## 2016-02-08 LAB — CREATININE, SERUM
Creatinine, Ser: 1.36 mg/dL — ABNORMAL HIGH (ref 0.44–1.00)
GFR calc Af Amer: 53 mL/min — ABNORMAL LOW (ref >60)
GFR calc non Af Amer: 46 mL/min — ABNORMAL LOW (ref >60)

## 2016-02-08 LAB — BUN: BUN: 21 mg/dL — ABNORMAL HIGH (ref 6–20)

## 2016-02-08 SURGERY — RENAL ANGIOGRAPHY
Anesthesia: Moderate Sedation

## 2016-02-08 MED ORDER — FAMOTIDINE 20 MG PO TABS: 40.0000 mg | ORAL_TABLET | ORAL | Status: DC | PRN

## 2016-02-08 MED ORDER — IOPAMIDOL (ISOVUE-300) INJECTION 61%
INTRAVENOUS | Status: DC | PRN
  Administered 2016-02-08: 30 mL via INTRAVENOUS

## 2016-02-08 MED ORDER — FENTANYL CITRATE (PF) 100 MCG/2ML IJ SOLN
INTRAMUSCULAR | Status: AC
  Filled 2016-02-08: qty 2

## 2016-02-08 MED ORDER — SODIUM CHLORIDE 0.9 % IV SOLN: 500.0000 mL | Freq: Once | INTRAVENOUS | Status: DC | PRN

## 2016-02-08 MED ORDER — DEXTROSE 5 % IV SOLN
INTRAVENOUS | Status: AC
  Filled 2016-02-08: qty 1.5

## 2016-02-08 MED ORDER — METHYLPREDNISOLONE SODIUM SUCC 125 MG IJ SOLR: 125.0000 mg | INTRAMUSCULAR | Status: DC | PRN

## 2016-02-08 MED ORDER — ACETAMINOPHEN 325 MG PO TABS: 325.0000 mg | ORAL_TABLET | ORAL | Status: DC | PRN

## 2016-02-08 MED ORDER — HYDROMORPHONE HCL 1 MG/ML IJ SOLN: 0.5000 mg | INTRAMUSCULAR | Status: DC | PRN

## 2016-02-08 MED ORDER — ONDANSETRON HCL 4 MG/2ML IJ SOLN: 4.0000 mg | Freq: Four times a day (QID) | INTRAMUSCULAR | Status: DC | PRN

## 2016-02-08 MED ORDER — GUAIFENESIN-DM 100-10 MG/5ML PO SYRP: 15.0000 mL | ORAL_SOLUTION | ORAL | Status: DC | PRN

## 2016-02-08 MED ORDER — FENTANYL CITRATE (PF) 100 MCG/2ML IJ SOLN
INTRAMUSCULAR | Status: DC | PRN
  Administered 2016-02-08 (×2): 50 ug via INTRAVENOUS

## 2016-02-08 MED ORDER — LIDOCAINE HCL (PF) 1 % IJ SOLN
INTRAMUSCULAR | Status: AC
  Filled 2016-02-08: qty 30

## 2016-02-08 MED ORDER — CLOPIDOGREL BISULFATE 75 MG PO TABS
75.0000 mg | ORAL_TABLET | Freq: Every day | ORAL | 11 refills | Status: DC
Start: 2016-02-08 — End: 2018-06-21

## 2016-02-08 MED ORDER — HYDRALAZINE HCL 20 MG/ML IJ SOLN: 5.0000 mg | INTRAMUSCULAR | Status: DC | PRN

## 2016-02-08 MED ORDER — PHENOL 1.4 % MT LIQD: 1.0000 | OROMUCOSAL | Status: DC | PRN

## 2016-02-08 MED ORDER — HYDROMORPHONE HCL 1 MG/ML IJ SOLN: 1.0000 mg | Freq: Once | INTRAMUSCULAR | Status: DC

## 2016-02-08 MED ORDER — LABETALOL HCL 5 MG/ML IV SOLN: 10.0000 mg | INTRAVENOUS | Status: DC | PRN

## 2016-02-08 MED ORDER — MIDAZOLAM HCL 2 MG/2ML IJ SOLN
INTRAMUSCULAR | Status: AC
  Filled 2016-02-08: qty 4

## 2016-02-08 MED ORDER — HEPARIN SODIUM (PORCINE) 1000 UNIT/ML IJ SOLN
INTRAMUSCULAR | Status: DC | PRN
  Administered 2016-02-08: 5000 [IU] via INTRAVENOUS

## 2016-02-08 MED ORDER — ACETAMINOPHEN 325 MG RE SUPP
325.0000 mg | RECTAL | Status: DC | PRN
  Filled 2016-02-08: qty 2

## 2016-02-08 MED ORDER — MIDAZOLAM HCL 2 MG/2ML IJ SOLN
INTRAMUSCULAR | Status: DC | PRN
  Administered 2016-02-08 (×2): 2 mg via INTRAVENOUS

## 2016-02-08 MED ORDER — OXYCODONE-ACETAMINOPHEN 5-325 MG PO TABS: 1.0000 | ORAL_TABLET | ORAL | Status: DC | PRN

## 2016-02-08 MED ORDER — METOPROLOL TARTRATE 5 MG/5ML IV SOLN: 2.0000 mg | INTRAVENOUS | Status: DC | PRN

## 2016-02-08 MED ORDER — SODIUM CHLORIDE 0.9 % IV SOLN: INTRAVENOUS | Status: DC

## 2016-02-08 MED ORDER — HEPARIN SODIUM (PORCINE) 1000 UNIT/ML IJ SOLN
INTRAMUSCULAR | Status: AC
  Filled 2016-02-08: qty 1

## 2016-02-08 SURGICAL SUPPLY — 15 items
BALLN LUTONIX DCB 6X40X130 (BALLOONS) ×3
BALLOON LUTONIX DCB 6X40X130 (BALLOONS) ×1 IMPLANT
CANNULA 5F STIFF (CANNULA) ×3 IMPLANT
CATH C2 65CM (CATHETERS) ×3 IMPLANT
CATH PIG 70CM (CATHETERS) ×3 IMPLANT
DEVICE PRESTO INFLATION (MISCELLANEOUS) ×3 IMPLANT
DEVICE STARCLOSE SE CLOSURE (Vascular Products) ×3 IMPLANT
GLIDEWIRE ADV .035X260CM (WIRE) ×3 IMPLANT
PACK ANGIOGRAPHY (CUSTOM PROCEDURE TRAY) ×3 IMPLANT
SHEATH ANL2 6FRX45 HC (SHEATH) ×3 IMPLANT
SHEATH BRITE TIP 5FRX11 (SHEATH) ×3 IMPLANT
SYR MEDRAD MARK V 150ML (SYRINGE) ×3 IMPLANT
TUBING CONTRAST HIGH PRESS 72 (TUBING) ×3 IMPLANT
WIRE J 3MM .035X145CM (WIRE) ×3 IMPLANT
WIRE MAGIC TORQUE 260C (WIRE) ×3 IMPLANT

## 2016-02-08 NOTE — OR Nursing (Signed)
Prior to start of case Dr Wyn Quakerew notified of GFR, instructed to infuse ns at 200 ml/hr

## 2016-02-08 NOTE — Op Note (Signed)
[facility] Percutaneous Study/Intervention Procedural Note    Surgeon(s): [employer]  Assistants: none  Pre-operative Diagnosis: Recurrent right renal artery stenosis, renovascular hypertension, renal insufficiency with a solitary kidney  Post-operative diagnosis: Same  Procedure(s) Performed: 1. Ultrasound guidance for vascular access right femoral artery 2. Catheter placement into right renal artery from right femoral approach 3. Aortogram and selective right renal angiogram  4. Percutaneous transluminal angioplasty with a 6 mm diameter by 4 cm length Lutonix drug-coated balloon to the right renal artery stent 5. StarClose closure device right femoral artery  Contrast: 30 cc  EBL: 20 cc   Fluroro Time: 2.1 minutes  Moderate Conscious sedation time: Approximately 25 minutes using 4 mg of Versed and 100 mcg of Fentanyl  Indications: The patient is a 45 y.o.female with worsening severe hypertension despite multiple medications and the addition of 2 new medication since I had last seen her. In addition, her GFR had dropped about 20 points over the past year. The patient has suboptimal blood pressure control despite multiple antihypertensives and worsening renal function with a solitary kidney highly suggestive of recurrent right renal artery stenosis.  Given the clinical scenario, angiogram is indicated for further evaluation of her renal artery and potential treatment. Risks and benefits are discussed and informed consent is obtained.  Procedure: The patient was identified and appropriate procedural time out was performed. The patient was then placed supine on the table and prepped and draped in the usual sterile fashion.Moderate conscious sedation was administered with a face to face encounter with the patient throughout the procedure with my supervision of the RN administering  medicines and monitoring the patients vital signs and mental status throughout from the start of the procedure until the patient was taken to the recovery room. Ultrasound was used to evaluate the right common femoral artery. It was patent . A digital ultrasound image was acquired. A Seldinger needle was used to access the right common femoral artery under direct ultrasound guidance and a permanent image was performed. A 0.035 J wire was advanced without resistance and a 5Fr sheath was placed. Pigtail catheter was placed into the aorta at the L1 level and an AP aortogram was performed. This demonstrated that the aorta was widely patent.  No left renal artery flow. In stent stenosis in the proximal right renal artery which measured 72% by our vessel analysis software with my visual evaluation being similar. The patient was then systemically heparinized with 5000 units of intravenous heparin. I used a C2 catheter to cannulate the right renal artery and selective imaging was performed. This confirmed a moderate stenosis of the proximal portion of the stent in the right renal artery.  At this point I selected the Magic torque wire and crossed the lesion without difficulty parking the wire well into renal artery branches beyond the previously placed stent. I upsized to a 6 [place] Ansell sheath and removed the diagnostic catheter I then selected a 6 mm diameter x 4 mm length Lutonix drug-coated balloon and brought this across the lesion within and just beyond the stent.  This was inflated encompassing the lesion with its proximal extent going back into the aorta and its distal end just beyond the previous stent.  This was inflated to 12 ATM and the waist resolved.  Completion angiogram showed only about a 10-15% residual stenosis with a brisk nephrogram.   The guide catheter was removed. Oblique arteriogram was performed of the right femoral artery and StarClose closure device was  deployed in the usual fashion  with excellent hemostatic result. The patient was taken to the recovery room in stable condition having tolerated the procedure well.  Findings:  Aortogram/Renal Arteries:aorta widely patent.  No left renal artery flow. In stent stenosis in the proximal right renal artery which measured 72% by our vessel analysis software with my visual evaluation being similar.   Condition:  Stable  Complications: None   Leotis Pain 02/08/2016 1:12 PM   This note was created with Dragon Medical transcription system. Any errors in dictation are purely unintentional.'

## 2016-02-08 NOTE — H&P (Signed)
[facility] History & Physical Update  The patient was interviewed and re-examined.  The patient's previous History and Physical has been reviewed and is unchanged.  There is no change in the plan of care. We plan to proceed with the scheduled procedure.  Festus BarrenJason Aubryana Vittorio, MD  02/08/2016, 12:05 PM

## 2016-02-11 ENCOUNTER — Encounter: Payer: Self-pay | Admitting: Vascular Surgery

## 2016-03-26 ENCOUNTER — Ambulatory Visit (INDEPENDENT_AMBULATORY_CARE_PROVIDER_SITE_OTHER): Payer: Medicaid Other | Admitting: Vascular Surgery

## 2016-03-26 ENCOUNTER — Encounter (INDEPENDENT_AMBULATORY_CARE_PROVIDER_SITE_OTHER): Payer: Medicaid Other

## 2016-04-22 ENCOUNTER — Other Ambulatory Visit (INDEPENDENT_AMBULATORY_CARE_PROVIDER_SITE_OTHER): Payer: Self-pay | Admitting: Vascular Surgery

## 2016-04-22 ENCOUNTER — Ambulatory Visit (INDEPENDENT_AMBULATORY_CARE_PROVIDER_SITE_OTHER): Payer: Medicaid Other | Admitting: Vascular Surgery

## 2016-04-22 ENCOUNTER — Other Ambulatory Visit (INDEPENDENT_AMBULATORY_CARE_PROVIDER_SITE_OTHER): Payer: Medicaid Other

## 2016-04-22 ENCOUNTER — Encounter (INDEPENDENT_AMBULATORY_CARE_PROVIDER_SITE_OTHER): Payer: Self-pay | Admitting: Vascular Surgery

## 2016-04-22 VITALS — BP 141/84 | HR 51 | Resp 16 | Ht 64.0 in | Wt 347.0 lb

## 2016-04-22 DIAGNOSIS — N183 Chronic kidney disease, stage 3 unspecified: Secondary | ICD-10-CM

## 2016-04-22 DIAGNOSIS — I701 Atherosclerosis of renal artery: Secondary | ICD-10-CM

## 2016-04-22 DIAGNOSIS — Z48812 Encounter for surgical aftercare following surgery on the circulatory system: Secondary | ICD-10-CM | POA: Diagnosis not present

## 2016-04-22 DIAGNOSIS — I150 Renovascular hypertension: Secondary | ICD-10-CM

## 2016-04-22 NOTE — Progress Notes (Signed)
MRN : [id number]  Jacqueline Parks is a 46 y.o. (06/07/1970) female who presents with chief complaint of  Chief Complaint  Patient presents with  . Re-evaluation    Follow up renal ultrasound  .  History of Present Illness: Patient returns today in follow up of Renal artery stenosis and renovascular hypertension. She underwent right renal artery angioplasty for in-stent stenosis about a month ago. In 24 hours of the procedure, she artery noticed a significant difference in her blood pressure. It has been much better controlled since that time. She had no periprocedural complications. Her duplex today shows a patent right renal artery stent with normal velocities and a known left renal artery occlusion.         Current Outpatient Prescriptions  Medication Sig Dispense Refill  . albuterol (PROVENTIL HFA;VENTOLIN HFA) 108 (90 Base) MCG/ACT inhaler Inhale into the lungs.    Marland Kitchen amLODipine (NORVASC) 10 MG tablet Take 10 mg by mouth.    . carvedilol (COREG) 25 MG tablet Take 25 mg by mouth.    . Fluticasone-Salmeterol (ADVAIR) 250-50 MCG/DOSE AEPB Inhale 1 puff into the lungs 2 (two) times daily.    Marland Kitchen lisinopril (PRINIVIL,ZESTRIL) 40 MG tablet Take 40 mg by mouth daily.    Marland Kitchen lisinopril-hydrochlorothiazide (PRINZIDE,ZESTORETIC) 10-12.5 MG tablet Take by mouth.    . Loratadine 10 MG CAPS Take by mouth.    . spironolactone (ALDACTONE) 100 MG tablet Take 100 mg by mouth daily.     No current facility-administered medications for this visit.         Past Medical History:  Diagnosis Date  . Arthritis   . Asthma   . Chronic kidney disease   . Hypertension   . Kidney failure          Past Surgical History:  Procedure Laterality Date  . ABDOMINAL HYSTERECTOMY    . APPENDECTOMY    . CESAREAN SECTION    . RENAL ARTERY STENT Right     Social History     Social History  Substance Use Topics  . Smoking status: Former Research scientist (life sciences)  . Smokeless tobacco:  Never Used  . Alcohol use No  Married with 2 teenage children  Family History      Family History  Problem Relation Age of Onset  . COPD Mother   . Heart disease Father    No bleeding disorders or clotting disorders      Allergies  Allergen Reactions  . Albuterol Sulfate Palpitations     REVIEW OF SYSTEMS (Negative unless checked)  Constitutional: _0 Weight loss  _1 Fever  _2 Chills Cardiac: _3 Chest pain   _4 Chest pressure   _5 Palpitations   _6 Shortness of breath when laying flat   _7 Shortness of breath at rest   _8 Shortness of breath with exertion. Vascular:  _9 Pain in legs with walking   _10 Pain in legs at rest   _11 Pain in legs when laying flat   _12 Claudication   _13 Pain in feet when walking  _14 Pain in feet at rest  _15 Pain in feet when laying flat   _16 History of DVT   _17 Phlebitis   _18 Swelling in legs   _19 Varicose veins   _20 Non-healing ulcers Pulmonary:   _21 Uses home oxygen   _22 Productive cough   _23 Hemoptysis   _24 Wheeze  _25 COPD   _26 Asthma Neurologic:  _27 Dizziness  _28 Blackouts   _29 Seizures   _30 History of stroke   _31 History of TIA  _32 Aphasia   _33 Temporary blindness   _34 Dysphagia   _35 Weakness or numbness in arms   _36   Weakness or numbness in legs Musculoskeletal:  _0 Arthritis   _1 Joint swelling   _2 Joint pain   _3 Low back pain Hematologic:  _4 Easy bruising  _5 Easy bleeding   _6 Hypercoagulable state   _7 Anemic   Gastrointestinal:  _8 Blood in stool   _9 Vomiting blood  _10 Gastroesophageal reflux/heartburn   _11 Abdominal pain Genitourinary:  _12 Chronic kidney disease   _13 Difficult urination  _14 Frequent urination  _15 Burning with urination   _16 Hematuria Skin:  _17 Rashes   _18 Ulcers   _19 Wounds Psychological:  _20 History of anxiety   _21  History of major depression.  Physical Examination  BP 118/80   Pulse 72   Resp 16   Ht _22  (1.626 m)   Wt (!) 346 lb (156.9 kg)   BMI 59.39 kg/m  Gen:  WD/WN, NAD. obese Head: [place]/AT, No temporalis wasting. Ear/Nose/Throat: Hearing grossly intact,  nares w/o erythema or drainage, trachea midline Eyes: Conjunctiva clear. Sclera non-icteric Neck: Supple.  No JVD.  Pulmonary:  Good air movement, no use of accessory muscles.  Cardiac: RRR, normal S1, S2 Vascular:  Vessel Right Left  Radial Palpable Palpable  Ulnar Palpable Palpable  Brachial Palpable Palpable  Carotid Palpable, without bruit Palpable, without bruit  Aorta Not palpable N/A  Femoral Palpable Palpable  Popliteal Palpable Palpable  PT Palpable Palpable  DP Palpable Palpable   Gastrointestinal: soft, non-tender/non-distended. No guarding/reflex.  Musculoskeletal: M/S 5/5 throughout.  No deformity or atrophy. 1+ BLE edema. Neurologic: Sensation grossly intact in extremities.  Symmetrical.  Speech is fluent.  Psychiatric: Judgment intact, Mood & affect appropriate for pt's clinical situation. Dermatologic: No rashes or ulcers noted.  No cellulitis or open wounds. Lymph : No Cervical, Axillary, or Inguinal lymphadenopathy.      Labs Recent Results (from the past 2160 hour(s))  BUN     Status: Abnormal   Collection Time: 02/08/16 11:20 AM  Result Value Ref Range   BUN 21 (H) 6 - 20 mg/dL  Creatinine, serum     Status: Abnormal   Collection Time: 02/08/16 11:20 AM  Result Value Ref Range   Creatinine, Ser 1.36 (H) 0.44 - 1.00 mg/dL   GFR calc non Af Amer 46 (L) >60 mL/min   GFR calc Af Amer 53 (L) >60 mL/min    Comment: (NOTE) The eGFR has been calculated using the CKD EPI equation. This calculation has not been validated in all clinical situations. eGFR's persistently <60 mL/min signify possible Chronic Kidney Disease.     Radiology No results found.    Assessment/Plan CKD (chronic kidney disease) stage 3, GFR 30-59 ml/min This is a significant decline in her GFR from 2 years ago when it was approximately 67. It is now down to 47. This is a very worrisome problem given her solitary kidney with a previous intervention.  Renovascular  hypertension She underwent right renal artery angioplasty for in-stent stenosis about a month ago. In 24 hours of the procedure, she artery noticed a significant difference in her blood pressure. It has been much better controlled since that time. She had no periprocedural complications. Her duplex today shows a patent right renal artery stent with normal velocities and a known left renal artery occlusion. I will defer further management of her antihypertensives to her primary care physician.    Renal artery stenosis (HCC) She underwent right renal artery angioplasty for in-stent stenosis about a month ago. In 24 hours of the procedure, she artery noticed a significant difference in her blood pressure. It has been much better controlled since that time. She had  no periprocedural complications. Her duplex today shows a patent right renal artery stent with normal velocities and a known left renal artery occlusion. This is a marked improvement. I will plan to recheck her in about 3-4 months with a duplex for follow-up.    No problem-specific Assessment & Plan notes found for this encounter.    Leotis Pain, MD  04/22/2016 8:51 AM    This note was created with Dragon medical transcription system.  Any errors from dictation are purely unintentional

## 2016-08-19 ENCOUNTER — Ambulatory Visit (INDEPENDENT_AMBULATORY_CARE_PROVIDER_SITE_OTHER): Payer: Medicaid Other | Admitting: Vascular Surgery

## 2016-08-19 ENCOUNTER — Encounter (INDEPENDENT_AMBULATORY_CARE_PROVIDER_SITE_OTHER): Payer: Medicaid Other

## 2016-10-10 ENCOUNTER — Ambulatory Visit (INDEPENDENT_AMBULATORY_CARE_PROVIDER_SITE_OTHER): Payer: Medicaid Other | Admitting: Vascular Surgery

## 2016-10-10 ENCOUNTER — Encounter (INDEPENDENT_AMBULATORY_CARE_PROVIDER_SITE_OTHER): Payer: Medicaid Other

## 2016-12-05 ENCOUNTER — Encounter (INDEPENDENT_AMBULATORY_CARE_PROVIDER_SITE_OTHER): Payer: Medicaid Other

## 2016-12-05 ENCOUNTER — Ambulatory Visit (INDEPENDENT_AMBULATORY_CARE_PROVIDER_SITE_OTHER): Payer: Medicaid Other | Admitting: Vascular Surgery

## 2017-01-02 ENCOUNTER — Ambulatory Visit (INDEPENDENT_AMBULATORY_CARE_PROVIDER_SITE_OTHER): Payer: Medicaid Other | Admitting: Vascular Surgery

## 2017-01-02 ENCOUNTER — Encounter (INDEPENDENT_AMBULATORY_CARE_PROVIDER_SITE_OTHER): Payer: Medicaid Other

## 2017-01-11 DIAGNOSIS — I214 Non-ST elevation (NSTEMI) myocardial infarction: Secondary | ICD-10-CM | POA: Insufficient documentation

## 2017-01-12 DIAGNOSIS — Z90710 Acquired absence of both cervix and uterus: Secondary | ICD-10-CM | POA: Insufficient documentation

## 2017-01-12 DIAGNOSIS — I160 Hypertensive urgency: Secondary | ICD-10-CM | POA: Insufficient documentation

## 2017-02-09 DIAGNOSIS — N179 Acute kidney failure, unspecified: Secondary | ICD-10-CM | POA: Insufficient documentation

## 2017-02-10 ENCOUNTER — Ambulatory Visit (INDEPENDENT_AMBULATORY_CARE_PROVIDER_SITE_OTHER): Payer: Medicaid Other | Admitting: Vascular Surgery

## 2017-02-16 ENCOUNTER — Ambulatory Visit (INDEPENDENT_AMBULATORY_CARE_PROVIDER_SITE_OTHER): Payer: Medicaid Other | Admitting: Vascular Surgery

## 2017-02-17 ENCOUNTER — Ambulatory Visit (INDEPENDENT_AMBULATORY_CARE_PROVIDER_SITE_OTHER): Payer: Medicaid Other | Admitting: Vascular Surgery

## 2017-02-18 ENCOUNTER — Encounter (INDEPENDENT_AMBULATORY_CARE_PROVIDER_SITE_OTHER): Payer: Self-pay | Admitting: Vascular Surgery

## 2017-02-18 ENCOUNTER — Ambulatory Visit (INDEPENDENT_AMBULATORY_CARE_PROVIDER_SITE_OTHER): Payer: Medicaid Other | Admitting: Vascular Surgery

## 2017-02-18 VITALS — BP 135/77 | HR 70 | Resp 18 | Ht 65.0 in | Wt 349.0 lb

## 2017-02-18 DIAGNOSIS — I701 Atherosclerosis of renal artery: Secondary | ICD-10-CM | POA: Diagnosis not present

## 2017-02-18 DIAGNOSIS — N183 Chronic kidney disease, stage 3 unspecified: Secondary | ICD-10-CM

## 2017-02-18 DIAGNOSIS — E785 Hyperlipidemia, unspecified: Secondary | ICD-10-CM | POA: Diagnosis not present

## 2017-02-18 DIAGNOSIS — I150 Renovascular hypertension: Secondary | ICD-10-CM

## 2017-02-18 NOTE — Progress Notes (Signed)
Subjective:    Patient ID: Jacqueline Parks, female    DOB: [birth year], 46 y.o.   MRN: [id number] Chief Complaint  Patient presents with  . Follow-up    3-4 month Renal   The patient presents to discuss renal artery stenosis.  The patient was last seen in February 2018 after undergoing a right renal artery angiogram for restenosis of her right renal artery stent which was placed in 2013.  The patient was scheduled to undergo a renal artery duplex however her insurance required a medical visit before approving the ultrasound the patient endorses experiencing an MI on January 09, 2017.  She was treated at [facility] for this.  Aspirin and atorvastatin were added to her medications.  The patient continues to take multiple antihypertensive medications (four total) with a diuretic to achieve blood pressures of 115-140 systolic.  Since the patient's MI she has been keeping a blood pressure log which I reviewed.  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  There is no recent kidney function and epic and patient is unaware of her most recent renal panel.  Patient denies any fever, nausea vomiting.   Review of Systems  Constitutional: Negative.   HENT: Negative.   Eyes: Negative.   Respiratory: Negative.   Cardiovascular:       MI (01/2017)  Gastrointestinal: Negative.   Endocrine: Negative.   Genitourinary:       Chronic renal insufficiency  Musculoskeletal: Negative.   Skin: Negative.   Allergic/Immunologic: Negative.   Neurological: Negative.   Hematological: Negative.   Psychiatric/Behavioral: Negative.       Objective:   Physical Exam  Constitutional: She is oriented to person, place, and time. She appears well-developed and well-nourished. No distress.  HENT:  Head: Normocephalic and atraumatic.  Eyes: Conjunctivae are normal. Pupils are equal, round, and reactive to light.  Neck: Normal range of motion.    Cardiovascular: Normal rate, regular rhythm, normal heart sounds and intact distal pulses.  Pulses:      Radial pulses are 2+ on the right side, and 2+ on the left side.       Dorsalis pedis pulses are 2+ on the right side, and 2+ on the left side.       Posterior tibial pulses are 2+ on the right side, and 2+ on the left side.  Pulmonary/Chest: Effort normal and breath sounds normal.  Musculoskeletal: Normal range of motion. She exhibits edema (Mild bilateral lower extremity edema noted).  Neurological: She is alert and oriented to person, place, and time.  Skin: Skin is warm and dry. She is not diaphoretic.  Psychiatric: She has a normal mood and affect. Her behavior is normal. Judgment and thought content normal.  Vitals reviewed.  BP 135/77 (BP Location: Right Arm, Patient Position: Sitting)   Pulse 70   Resp 18   Ht 5\' 5"  (1.651 m)   Wt (!) 349 lb (158.3 kg)   BMI 58.08 kg/m   Past Medical History:  Diagnosis Date  . Arthritis   . Asthma   . Chronic kidney disease   . Hypertension   . Kidney failure    Social History   Socioeconomic History  . Marital status: Married    Spouse name: Not on file  . Number of children: Not on file  . Years of education: Not on file  . Highest education level: Not on file  Social Needs  . Financial  resource strain: Not on file  . Food insecurity - worry: Not on file  . Food insecurity - inability: Not on file  . Transportation needs - medical: Not on file  . Transportation needs - non-medical: Not on file  Occupational History  . Not on file  Tobacco Use  . Smoking status: Former Games developermoker  . Smokeless tobacco: Never Used  Substance and Sexual Activity  . Alcohol use: No  . Drug use: No  . Sexual activity: Not on file  Other Topics Concern  . Not on file  Social History Narrative  . Not on file   Past Surgical History:  Procedure Laterality Date  . ABDOMINAL HYSTERECTOMY    . APPENDECTOMY    . CESAREAN SECTION    .  PERIPHERAL VASCULAR CATHETERIZATION N/A 02/08/2016   Procedure: Renal Angiography;  Surgeon: Annice NeedyJason S Dew, MD;  Location: [facility] INVASIVE CV LAB;  Service: Cardiovascular;  Laterality: N/A;  . RENAL ARTERY STENT Right    Family History  Problem Relation Age of Onset  . COPD Mother   . Heart disease Father    Allergies  Allergen Reactions  . Proventil [Albuterol] Palpitations and Other (See Comments)    Tolerates PROAIR Inhaler (patient is allergic to BRAND NAME PROVENTIL DUE TO BLEND)  . Soap Rash    Dial soap causes a blistery rash.      Assessment & Plan:  The patient presents to discuss renal artery stenosis.  The patient was last seen in February 2018 after undergoing a right renal artery angiogram for restenosis of her right renal artery stent which was placed in 2013.  The patient was scheduled to undergo a renal artery duplex however her insurance required a medical visit before approving the ultrasound the patient endorses experiencing an MI on January 09, 2017.  [facility].  Aspirin and atorvastatin were added to her medications.  The patient continues to take multiple antihypertensive medications (four total) with a diuretic to achieve blood pressures of 115-140 systolic.  Since the patient's MI she has been keeping a blood pressure log which I reviewed.  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  There is no recent kidney function and epic and patient is unaware of her most recent renal panel.  Patient denies any fever, nausea vomiting.  1. Renovascular hypertension - Stable As below  2. 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  This was treated end of 2017 for restenosis of the stent. 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  - VAS US RENAL ARTERY DUPLEX; Future  3. CKD (chronic kidney disease) stage 3, GFR 30-59 ml/min (HCC) - Stable There are no new renal panel results in epic The patient is unaware of her most recent renal function Patient does have a past medical history of chronic renal disease complicated by an occluded left renal artery and stenotic right renal artery  4. Hyperlipidemia, unspecified hyperlipidemia type - Stable Encouraged good control as its slows the progression of atherosclerotic disease  Current Outpatient Medications on File Prior to Visit  Medication Sig Dispense Refill  . acetaminophen (TYLENOL) 500 MG tablet Take by mouth.    Marland Kitchen. amLODipine (NORVASC) 10 MG tablet Take 10 mg by mouth daily. In am.    . aspirin EC 81 MG tablet Take by mouth.    Marland Kitchen. atorvastatin (LIPITOR) 80 MG tablet Take by mouth.    . carvedilol (COREG) 25 MG tablet Take 25 mg by mouth 2 (two) times daily.     . chlorthalidone (HYGROTON) 25 MG tablet Take 25 mg by mouth daily.  1  . clopidogrel (PLAVIX) 75 MG tablet Take 1 tablet (75 mg total) by mouth daily. 30 tablet 11  . diclofenac sodium (VOLTAREN) 1 % GEL Apply topically.    . Fluticasone-Salmeterol (ADVAIR) 250-50 MCG/DOSE AEPB Inhale 1 puff into the lungs 2 (two) times daily.    . hydrALAZINE (APRESOLINE) 25 MG tablet Take 25 mg by mouth 2 (two) times daily.  2  . lisinopril (PRINIVIL,ZESTRIL) 40 MG tablet Take 40 mg by mouth every evening.     . loratadine (CLARITIN) 10 MG tablet Take 10 mg by mouth daily. In am.  11  . nitroGLYCERIN (NITROSTAT) 0.4 MG SL tablet PLACE 1 TABLET UNDER TONGUE EVERY 5 MINUTES AS  NEEDED FOR CHEST PAIN. MAY REPEAT UP TO 3 TIMES THEN CALL 911.  1  . omeprazole (PRILOSEC) 40 MG  capsule Take by mouth.    Marland Kitchen. PROAIR HFA 108 (90 Base) MCG/ACT inhaler Inhale 1-2 puffs into the lungs every 6 (six) hours as needed for wheezing or shortness of breath.    . spironolactone (ALDACTONE) 100 MG tablet Take 100 mg by mouth daily. In am.    . Vitamin D, Ergocalciferol, (DRISDOL) 50000 units CAPS capsule Take by mouth.     No current facility-administered medications on file prior to visit.    There are no Patient Instructions on file for this visit. No Follow-up on file.  KIMBERLY A STEGMAYER, PA-C

## 2017-03-11 ENCOUNTER — Encounter (INDEPENDENT_AMBULATORY_CARE_PROVIDER_SITE_OTHER): Payer: Medicaid Other

## 2017-03-11 ENCOUNTER — Ambulatory Visit (INDEPENDENT_AMBULATORY_CARE_PROVIDER_SITE_OTHER): Payer: Medicaid Other | Admitting: Vascular Surgery

## 2017-03-20 ENCOUNTER — Encounter (INDEPENDENT_AMBULATORY_CARE_PROVIDER_SITE_OTHER): Payer: Self-pay | Admitting: Vascular Surgery

## 2017-03-20 ENCOUNTER — Ambulatory Visit (INDEPENDENT_AMBULATORY_CARE_PROVIDER_SITE_OTHER): Payer: Medicaid Other | Admitting: Vascular Surgery

## 2017-03-20 ENCOUNTER — Ambulatory Visit (INDEPENDENT_AMBULATORY_CARE_PROVIDER_SITE_OTHER): Payer: Medicaid Other

## 2017-03-20 DIAGNOSIS — N183 Chronic kidney disease, stage 3 unspecified: Secondary | ICD-10-CM

## 2017-03-20 DIAGNOSIS — I701 Atherosclerosis of renal artery: Secondary | ICD-10-CM

## 2017-03-20 DIAGNOSIS — I150 Renovascular hypertension: Secondary | ICD-10-CM

## 2017-03-20 NOTE — Patient Instructions (Signed)
Renal Artery Stenosis Renal artery stenosis (RAS) is narrowing of the artery that carries blood to your kidneys. It can affect one or both kidneys. Your kidneys filter waste and extra fluid from your blood. You get rid of the waste and fluid when you urinate. Your kidneys also make an important chemical messenger (hormone) called renin. Renin helps regulate your blood pressure. The first sign of RAS may be high blood pressure. Over time, other symptoms can develop. What are the causes? Plaque buildup in your arteries (atherosclerosis) is the main cause of RAS. The plaques that cause this are made up of:  Fat.  Cholesterol.  Calcium.  Other substances.  As these substances build up in your renal artery, this slows the blood supply to your kidneys. The lack of blood and oxygen causes the signs and symptoms of RAS. A much less common cause of RAS is a disease called fibromuscular dysplasia. This disease causes abnormal cell growth that narrows the renal artery. It is not related to atherosclerosis. It occurs mostly in women who are 25-50 years old. It may be passed down through families. What increases the risk? You may be at risk for renal artery stenosis if you:  Are a man who is at least 49 years old.  Are a woman who is at least 49 years old.  Have high blood pressure.  Have high cholesterol.  Are a smoker.  Abuse alcohol.  Have diabetes or prediabetes.  Are overweight.  Have a family history of early heart disease.  What are the signs or symptoms? RAS usually develops slowly. You may not have any signs or symptoms at first. The earliest signs may be:  Developing high blood pressure.  A sudden increase in existing high blood pressure.  No longer responding to medicine that used to control your blood pressure.  Later signs and symptoms are due to kidney damage. They may include:  Fatigue.  Shortness of breath.  Swollen legs and feet.  Dry  skin.  Headaches.  Muscle cramps.  Loss of appetite.  Nausea or vomiting.  How is this diagnosed? Your health care provider may suspect RAS based on changes in your blood pressure and your risk factors. A physical exam will be done. Your health care provider may use a stethoscope to listen for a whooshing sound (bruit) that can occur where the renal artery is blocking blood flow. Several tests may be done to confirm a diagnosis of RAS. These may include:  Blood and urine tests to check your kidney function.  Imaging tests of your kidneys, such as: ? A test that involves using sound waves to create an image of your kidneys and the blood flow to your kidneys (ultrasound). ? A test in which dye is injected into one of your blood vessels so images can be taken as the dye flows through your renal arteries (angiogram). These tests can be done using X-rays, a CT scan (computed tomography angiogram, CTA), or a type of MRI (magnetic resonance angiogram, MRA).  How is this treated? Making lifestyle changes to reduce your risk factors is the first treatment option for early RAS. If the blood flow to one of your kidneys is cut by more than half, you may need medicine to:  Lower your blood pressure. This is the main medical treatment for RAS. You may need more than one type of medicine for this. The two types that work best for RAS are: ? ACE inhibitors. ? Angiotensin receptor blockers.  Reduce fluid   in the body (diuretics).  Lower your cholesterol (statins).  If medicine is not enough to control RAS, you may need surgery. This may involve:  Threading a tube with an inflatable balloon into the renal artery to force it open (angioplasty).  Removing plaque from inside the artery (endarterectomy).  Follow these instructions at home:  Take medicines only as directed by your health care provider.  Make any lifestyle changes recommended by your health care provider. This may include: ? Working  with a dietitian to maintain a heart-healthy diet. This type of diet is low in saturated fat, salt, and added sugar. ? Starting an exercise program as directed by your health care provider. ? Maintaining a healthy weight. ? Quitting smoking. ? Not abusing alcohol.  Keep all follow-up visits as directed by your health care provider. This is important. Contact a health care provider if:  Your symptoms of RAS are not getting better.  Your symptoms are changing or getting worse. Get help right away if:  You have very bad pain in your back or abdomen.  You have blood in your urine. This information is not intended to replace advice given to you by your health care provider. Make sure you discuss any questions you have with your health care provider. Document Released: 11/20/2004 Document Revised: 08/02/2015 Document Reviewed: 06/09/2013 Elsevier Interactive Patient Education  2018 Elsevier Inc.  

## 2017-03-20 NOTE — Addendum Note (Signed)
Addended by: Annice NeedyEW, Ilsa Bonello S on: 03/20/2017 03:05 PM   Modules accepted: Orders

## 2017-03-20 NOTE — Assessment & Plan Note (Signed)
Stable per her report.  Given her solitary kidney and renal artery stenosis, she will be at high risk for deteriorating renal function.

## 2017-03-20 NOTE — Assessment & Plan Note (Signed)
Her renal artery duplex today shows normal velocities in her solitary right renal and stent.  She is a very high risk patient and I will continue to follow her on 6886-month intervals with duplex.

## 2017-03-20 NOTE — Assessment & Plan Note (Signed)
Blood pressure control markedly improved after intervention 

## 2017-03-20 NOTE — Progress Notes (Signed)
MRN : [id number]  Jacqueline Parks is a 47 y.o. ([birth year]) female who presents with chief complaint of  Chief Complaint  Patient presents with  . Follow-up    Ultrasound f/u  .  History of Present Illness: Patient returns today in follow up of renal artery stenosis.  She is status post right renal artery angioplasty about a year ago following an initial stent of her blood pressure has been running well and she has been taken off all of her blood pressure medications except for 1.  Her renal function she says is now stable at taking it hit following a myocardial infarction several months ago. Her renal artery duplex today shows normal velocities in her solitary right renal and stent.  Current Outpatient Medications  Medication Sig Dispense Refill  . acetaminophen (TYLENOL) 500 MG tablet Take by mouth.    Marland Kitchen amLODipine (NORVASC) 10 MG tablet Take 10 mg by mouth daily. In am.    . aspirin EC 81 MG tablet Take by mouth.    Marland Kitchen atorvastatin (LIPITOR) 80 MG tablet Take by mouth.    . carvedilol (COREG) 25 MG tablet Take 25 mg by mouth 2 (two) times daily.     . chlorthalidone (HYGROTON) 25 MG tablet Take 25 mg by mouth daily.  1  . clopidogrel (PLAVIX) 75 MG tablet Take 1 tablet (75 mg total) by mouth daily. 30 tablet 11  . diclofenac sodium (VOLTAREN) 1 % GEL Apply topically.    . Fluticasone-Salmeterol (ADVAIR) 250-50 MCG/DOSE AEPB Inhale 1 puff into the lungs 2 (two) times daily.    . hydrALAZINE (APRESOLINE) 25 MG tablet Take 25 mg by mouth 2 (two) times daily.  2  . lisinopril (PRINIVIL,ZESTRIL) 40 MG tablet Take 40 mg by mouth every evening.     . loratadine (CLARITIN) 10 MG tablet Take 10 mg by mouth daily. In am.  11  . nitroGLYCERIN (NITROSTAT) 0.4 MG SL tablet PLACE 1 TABLET UNDER TONGUE EVERY 5 MINUTES AS  NEEDED FOR CHEST PAIN. MAY REPEAT UP TO 3 TIMES THEN CALL 911.  1  . omeprazole (PRILOSEC) 40 MG capsule Take by mouth.    Marland Kitchen PROAIR HFA 108 (90 Base) MCG/ACT inhaler Inhale 1-2  puffs into the lungs every 6 (six) hours as needed for wheezing or shortness of breath.    . spironolactone (ALDACTONE) 100 MG tablet Take 100 mg by mouth daily. In am.    . Vitamin D, Ergocalciferol, (DRISDOL) 50000 units CAPS capsule Take by mouth.     No current facility-administered medications for this visit.     Past Medical History:  Diagnosis Date  . Arthritis   . Asthma   . Chronic kidney disease   . Hypertension   . Kidney failure     Past Surgical History:  Procedure Laterality Date  . ABDOMINAL HYSTERECTOMY    . APPENDECTOMY    . CESAREAN SECTION    . PERIPHERAL VASCULAR CATHETERIZATION N/A 02/08/2016   Procedure: Renal Angiography;  Surgeon: Annice Needy, MD;  Location: [facility] INVASIVE CV LAB;  Service: Cardiovascular;  Laterality: N/A;  . RENAL ARTERY STENT Right      Social History     Social History  Substance Use Topics  . Smoking status: Former Games developer  . Smokeless tobacco: Never Used  . Alcohol use No  Married with 2 teenage children  Family History      Family History  Problem Relation Age of Onset  . COPD Mother   .  Heart disease Father    No bleeding disorders or clotting disorders      Allergies  Allergen Reactions  . Albuterol Sulfate Palpitations     REVIEW OF SYSTEMS (Negative unless checked)  Constitutional: [] Weight loss  [] Fever  [] Chills Cardiac: [] Chest pain   [] Chest pressure   [] Palpitations   [] Shortness of breath when laying flat   [] Shortness of breath at rest   [] Shortness of breath with exertion. Vascular:  [] Pain in legs with walking   [] Pain in legs at rest   [] Pain in legs when laying flat   [] Claudication   [] Pain in feet when walking  [] Pain in feet at rest  [] Pain in feet when laying flat   [] History of DVT   [] Phlebitis   [x] Swelling in legs   [] Varicose veins   [] Non-healing ulcers Pulmonary:   [] Uses home oxygen   [] Productive cough   [] Hemoptysis   [] Wheeze  [] COPD   [] Asthma Neurologic:  [] Dizziness   [] Blackouts   [] Seizures   [] History of stroke   [] History of TIA  [] Aphasia   [] Temporary blindness   [] Dysphagia   [] Weakness or numbness in arms   [] Weakness or numbness in legs Musculoskeletal:  [x] Arthritis   [] Joint swelling   [] Joint pain   [] Low back pain Hematologic:  [] Easy bruising  [] Easy bleeding   [] Hypercoagulable state   [] Anemic   Gastrointestinal:  [] Blood in stool   [] Vomiting blood  [] Gastroesophageal reflux/heartburn   [] Abdominal pain Genitourinary:  [] Chronic kidney disease   [] Difficult urination  [] Frequent urination  [] Burning with urination   [] Hematuria Skin:  [] Rashes   [] Ulcers   [] Wounds Psychological:  [] History of anxiety   []  History of major depression      Physical Examination  There were no vitals taken for this visit. Gen:  WD/WN, NAD. obese Head: [place]/AT, No temporalis wasting. Dentition poor Ear/Nose/Throat: Hearing grossly intact, nares w/o erythema or drainage, trachea midline Eyes: Conjunctiva clear. Sclera non-icteric Neck: Supple.  No JVD.  Pulmonary:  Good air movement, no use of accessory muscles.  Cardiac: RRR Vascular:  Vessel Right Left  Radial Palpable Palpable                                    Musculoskeletal: M/S 5/5 throughout.  No deformity or atrophy. Mild LE edema. Neurologic: Sensation grossly intact in extremities.  Symmetrical.  Speech is fluent.  Psychiatric: Judgment intact, Mood & affect appropriate for pt's clinical situation. Dermatologic: No rashes or ulcers noted.  No cellulitis or open wounds.   Labs No results found for this or any previous visit (from the past 2160 hour(s)).  Radiology No results found.   Assessment/Plan  CKD (chronic kidney disease) stage 3, GFR 30-59 ml/min Stable per her report.  Given her solitary kidney and renal artery stenosis, she will be at high risk for deteriorating renal function.  Renovascular hypertension Blood pressure control markedly improved after  intervention  Renal artery stenosis (HCC) Her renal artery duplex today shows normal velocities in her solitary right renal and stent.  She is a very high risk patient and I will continue to follow her on 8223-month intervals with duplex.    Festus BarrenJason Dew, MD  03/20/2017 3:00 PM    This note was created with Dragon medical transcription system.  Any errors from dictation are purely unintentional

## 2017-04-01 ENCOUNTER — Encounter (INDEPENDENT_AMBULATORY_CARE_PROVIDER_SITE_OTHER): Payer: Medicaid Other

## 2017-04-01 ENCOUNTER — Ambulatory Visit (INDEPENDENT_AMBULATORY_CARE_PROVIDER_SITE_OTHER): Payer: Medicaid Other | Admitting: Vascular Surgery

## 2017-09-18 ENCOUNTER — Encounter (INDEPENDENT_AMBULATORY_CARE_PROVIDER_SITE_OTHER): Payer: Medicaid Other

## 2017-09-18 ENCOUNTER — Ambulatory Visit (INDEPENDENT_AMBULATORY_CARE_PROVIDER_SITE_OTHER): Payer: Medicaid Other | Admitting: Vascular Surgery

## 2017-09-29 ENCOUNTER — Ambulatory Visit (INDEPENDENT_AMBULATORY_CARE_PROVIDER_SITE_OTHER): Payer: Medicaid Other | Admitting: Vascular Surgery

## 2017-09-29 ENCOUNTER — Encounter

## 2017-09-29 ENCOUNTER — Ambulatory Visit (INDEPENDENT_AMBULATORY_CARE_PROVIDER_SITE_OTHER): Payer: Medicaid Other

## 2017-09-29 ENCOUNTER — Encounter (INDEPENDENT_AMBULATORY_CARE_PROVIDER_SITE_OTHER): Payer: Self-pay | Admitting: Vascular Surgery

## 2017-09-29 VITALS — BP 181/95 | HR 60 | Resp 18 | Ht 63.0 in | Wt 334.0 lb

## 2017-09-29 DIAGNOSIS — I701 Atherosclerosis of renal artery: Secondary | ICD-10-CM

## 2017-09-29 DIAGNOSIS — I150 Renovascular hypertension: Secondary | ICD-10-CM | POA: Diagnosis not present

## 2017-09-29 DIAGNOSIS — N183 Chronic kidney disease, stage 3 unspecified: Secondary | ICD-10-CM

## 2017-09-29 NOTE — Progress Notes (Signed)
Subjective:    Patient ID: Jacqueline Parks, female    DOB: 09/19/1970, 47 y.o.   MRN: [id number] Chief Complaint  Patient presents with  . Follow-up    55month renal   Patient presents for a 5368-month renal artery stenosis follow-up.  The patient has lost a total of 30 pounds.  The patient notes that her blood pressure is controlled and her kidney function is stable.  The patient underwent a bilateral renal artery ultrasound which was notable for a small atrophic left kidney with no flow detected.  This is stable.  Right normal size kidney with no evidence of right renal artery stenosis.  Patent stent.  Aorta measuring 1.8 cm.  Patient denies any fever, nausea or vomiting.  Review of Systems  Constitutional: Negative.   HENT: Negative.   Eyes: Negative.   Respiratory: Negative.   Cardiovascular: Negative.   Gastrointestinal: Negative.   Endocrine: Negative.   Genitourinary: Negative.   Musculoskeletal: Negative.   Skin: Negative.   Allergic/Immunologic: Negative.   Neurological: Negative.   Hematological: Negative.   Psychiatric/Behavioral: Negative.       Objective:   Physical Exam  Constitutional: She is oriented to person, place, and time. She appears well-developed and well-nourished. No distress.  HENT:  Head: Normocephalic and atraumatic.  Right Ear: External ear normal.  Left Ear: External ear normal.  Eyes: Pupils are equal, round, and reactive to light. Conjunctivae and EOM are normal.  Neck: Normal range of motion.  Cardiovascular: Normal rate, regular rhythm and normal heart sounds.  Pulmonary/Chest: Effort normal and breath sounds normal.  Abdominal: Soft. Bowel sounds are normal.  Musculoskeletal: Normal range of motion. She exhibits edema (Bilateral lower extremity edema).  Neurological: She is alert and oriented to person, place, and time.  Skin: Skin is warm and dry. She is not diaphoretic.  Psychiatric: She has a normal mood and affect. Her behavior is  normal. Judgment and thought content normal.  Vitals reviewed.  BP (!) 181/95 (BP Location: Right Arm)   Pulse 60   Resp 18   Ht 5\' 3"  (1.6 m)   Wt (!) 334 lb (151.5 kg)   BMI 59.17 kg/m   Past Medical History:  Diagnosis Date  . Arthritis   . Asthma   . Chronic kidney disease   . Hypertension   . Kidney failure    Social History   Socioeconomic History  . Marital status: Married    Spouse name: Not on file  . Number of children: Not on file  . Years of education: Not on file  . Highest education level: Not on file  Occupational History  . Not on file  Social Needs  . Financial resource strain: Not on file  . Food insecurity:    Worry: Not on file    Inability: Not on file  . Transportation needs:    Medical: Not on file    Non-medical: Not on file  Tobacco Use  . Smoking status: Former Games developermoker  . Smokeless tobacco: Never Used  Substance and Sexual Activity  . Alcohol use: No  . Drug use: No  . Sexual activity: Not on file  Lifestyle  . Physical activity:    Days per week: Not on file    Minutes per session: Not on file  . Stress: Not on file  Relationships  . Social connections:    Talks on phone: Not on file    Gets together: Not on file    Attends religious  service: Not on file    Active member of club or organization: Not on file    Attends meetings of clubs or organizations: Not on file    Relationship status: Not on file  . Intimate partner violence:    Fear of current or ex partner: Not on file    Emotionally abused: Not on file    Physically abused: Not on file    Forced sexual activity: Not on file  Other Topics Concern  . Not on file  Social History Narrative  . Not on file   Past Surgical History:  Procedure Laterality Date  . ABDOMINAL HYSTERECTOMY    . APPENDECTOMY    . CESAREAN SECTION    . PERIPHERAL VASCULAR CATHETERIZATION N/A 02/08/2016   Procedure: Renal Angiography;  Surgeon: Annice Needy, MD;  Location: [facility] INVASIVE CV LAB;   Service: Cardiovascular;  Laterality: N/A;  . RENAL ARTERY STENT Right    Family History  Problem Relation Age of Onset  . COPD Mother   . Heart disease Father    Allergies  Allergen Reactions  . Proventil [Albuterol] Palpitations and Other (See Comments)    Tolerates PROAIR Inhaler (patient is allergic to BRAND NAME PROVENTIL DUE TO BLEND)  . Soap Rash    Dial soap causes a blistery rash.      Assessment & Plan:  Patient presents for a 79-month renal artery stenosis follow-up.  The patient has lost a total of 30 pounds.  The patient notes that her blood pressure is controlled and her kidney function is stable.  The patient underwent a bilateral renal artery ultrasound which was notable for a small atrophic left kidney with no flow detected.  This is stable.  Right normal size kidney with no evidence of right renal artery stenosis.  Patent stent.  Aorta measuring 1.8 cm.  Patient denies any fever, nausea or vomiting.  1. Renovascular hypertension - Stable Patient's blood pressure was 181/95 this morning.  Patient states that she did not take her morning medications until she pulled into our parking lot. The patient was instructed to retake her blood pressure when she gets home.  If it continues to be elevated she should touch base with her primary care physician The patient notes that her blood pressure usually ranges between 130 / 140.   2. Renal artery stenosis (HCC) - Stable Studies reviewed. Asymptomatic, physical exam unremarkable, duplex notable for right renal artery / stent.  We will continue to surveil with a renal duplex and will see the patient back in six months. Patient to remain abstinent of tobacco use. I have discussed with the patient at length the risk factors for and pathogenesis of atherosclerotic disease and encouraged a healthy diet, regular exercise regimen and blood pressure / glucose control.  Patient was instructed to contact our office in the interim with  problems such as increasing / uncontrollable hypertension or changes in kidney function. The patient expresses their understanding.  - VAS US RENAL ARTERY DUPLEX; Future  3. CKD (chronic kidney disease) stage 3, GFR 30-59 ml/min (HCC) - Stable Stable Followed by Nephrologist  Current Outpatient Medications on File Prior to Visit  Medication Sig Dispense Refill  . acetaminophen (TYLENOL) 500 MG tablet Take by mouth.    Marland Kitchen amLODipine (NORVASC) 10 MG tablet Take 10 mg by mouth daily. In am.    . aspirin EC 81 MG tablet Take by mouth.    Marland Kitchen atorvastatin (LIPITOR) 80 MG tablet Take by mouth.    Marland Kitchen  carvedilol (COREG) 25 MG tablet Take 25 mg by mouth 2 (two) times daily.     . chlorthalidone (HYGROTON) 25 MG tablet Take 25 mg by mouth daily.  1  . clopidogrel (PLAVIX) 75 MG tablet Take 1 tablet (75 mg total) by mouth daily. 30 tablet 11  . diclofenac sodium (VOLTAREN) 1 % GEL Apply topically.    . Fluticasone-Salmeterol (ADVAIR) 250-50 MCG/DOSE AEPB Inhale 1 puff into the lungs 2 (two) times daily.    . hydrALAZINE (APRESOLINE) 25 MG tablet Take 25 mg by mouth 2 (two) times daily.  2  . lisinopril (PRINIVIL,ZESTRIL) 40 MG tablet Take 40 mg by mouth every evening.     . loratadine (CLARITIN) 10 MG tablet Take 10 mg by mouth daily. In am.  11  . nitroGLYCERIN (NITROSTAT) 0.4 MG SL tablet PLACE 1 TABLET UNDER TONGUE EVERY 5 MINUTES AS  NEEDED FOR CHEST PAIN. MAY REPEAT UP TO 3 TIMES THEN CALL 911.  1  . phentermine 15 MG capsule Take by mouth.    Marland Kitchen PROAIR HFA 108 (90 Base) MCG/ACT inhaler Inhale 1-2 puffs into the lungs every 6 (six) hours as needed for wheezing or shortness of breath.    . ranitidine (ZANTAC) 150 MG tablet Take by mouth.    . spironolactone (ALDACTONE) 100 MG tablet Take 100 mg by mouth daily. In am.    . Tiotropium Bromide Monohydrate (SPIRIVA RESPIMAT) 2.5 MCG/ACT AERS Inhale into the lungs.    . topiramate (TOPAMAX) 25 MG tablet Take by mouth.    . Vitamin D, Ergocalciferol,  (DRISDOL) 50000 units CAPS capsule Take by mouth.    Marland Kitchen omeprazole (PRILOSEC) 40 MG capsule Take by mouth.     No current facility-administered medications on file prior to visit.    There are no Patient Instructions on file for this visit. No follow-ups on file.  Rebie Peale A Travez Stancil, PA-C

## 2018-02-26 ENCOUNTER — Ambulatory Visit (INDEPENDENT_AMBULATORY_CARE_PROVIDER_SITE_OTHER): Payer: Medicaid Other | Admitting: Vascular Surgery

## 2018-02-26 ENCOUNTER — Encounter (INDEPENDENT_AMBULATORY_CARE_PROVIDER_SITE_OTHER): Payer: Medicaid Other

## 2018-03-17 DIAGNOSIS — R609 Edema, unspecified: Secondary | ICD-10-CM | POA: Insufficient documentation

## 2018-03-17 DIAGNOSIS — R0609 Other forms of dyspnea: Secondary | ICD-10-CM | POA: Insufficient documentation

## 2018-03-17 DIAGNOSIS — I129 Hypertensive chronic kidney disease with stage 1 through stage 4 chronic kidney disease, or unspecified chronic kidney disease: Secondary | ICD-10-CM | POA: Insufficient documentation

## 2018-04-02 ENCOUNTER — Encounter (INDEPENDENT_AMBULATORY_CARE_PROVIDER_SITE_OTHER): Payer: Medicaid Other

## 2018-04-02 ENCOUNTER — Ambulatory Visit (INDEPENDENT_AMBULATORY_CARE_PROVIDER_SITE_OTHER): Payer: Medicaid Other | Admitting: Vascular Surgery

## 2018-05-14 ENCOUNTER — Ambulatory Visit (INDEPENDENT_AMBULATORY_CARE_PROVIDER_SITE_OTHER): Payer: Medicaid Other | Admitting: Vascular Surgery

## 2018-05-14 ENCOUNTER — Ambulatory Visit (INDEPENDENT_AMBULATORY_CARE_PROVIDER_SITE_OTHER): Payer: Medicaid Other

## 2018-05-14 DIAGNOSIS — I701 Atherosclerosis of renal artery: Secondary | ICD-10-CM

## 2018-05-18 ENCOUNTER — Encounter (INDEPENDENT_AMBULATORY_CARE_PROVIDER_SITE_OTHER): Payer: Self-pay | Admitting: Vascular Surgery

## 2018-05-18 ENCOUNTER — Other Ambulatory Visit (INDEPENDENT_AMBULATORY_CARE_PROVIDER_SITE_OTHER): Payer: Self-pay | Admitting: Vascular Surgery

## 2018-05-18 DIAGNOSIS — I701 Atherosclerosis of renal artery: Secondary | ICD-10-CM

## 2018-06-21 ENCOUNTER — Other Ambulatory Visit (INDEPENDENT_AMBULATORY_CARE_PROVIDER_SITE_OTHER): Payer: Self-pay | Admitting: Nurse Practitioner

## 2018-06-21 ENCOUNTER — Other Ambulatory Visit (INDEPENDENT_AMBULATORY_CARE_PROVIDER_SITE_OTHER): Payer: Self-pay

## 2018-06-21 MED ORDER — CLOPIDOGREL BISULFATE 75 MG PO TABS: 75.0000 mg | ORAL_TABLET | Freq: Every day | ORAL | 11 refills | Status: AC

## 2019-05-17 ENCOUNTER — Ambulatory Visit (INDEPENDENT_AMBULATORY_CARE_PROVIDER_SITE_OTHER): Payer: Medicaid Other | Admitting: Vascular Surgery

## 2019-05-17 ENCOUNTER — Encounter (INDEPENDENT_AMBULATORY_CARE_PROVIDER_SITE_OTHER): Payer: Medicaid Other

## 2019-06-17 ENCOUNTER — Ambulatory Visit (INDEPENDENT_AMBULATORY_CARE_PROVIDER_SITE_OTHER): Payer: Medicaid Other | Admitting: Vascular Surgery

## 2019-06-17 ENCOUNTER — Encounter (INDEPENDENT_AMBULATORY_CARE_PROVIDER_SITE_OTHER): Payer: Medicaid Other

## 2019-06-20 ENCOUNTER — Ambulatory Visit (INDEPENDENT_AMBULATORY_CARE_PROVIDER_SITE_OTHER): Payer: Medicaid Other

## 2019-06-20 ENCOUNTER — Ambulatory Visit (INDEPENDENT_AMBULATORY_CARE_PROVIDER_SITE_OTHER): Payer: Medicaid Other | Admitting: Nurse Practitioner

## 2019-06-20 ENCOUNTER — Encounter (INDEPENDENT_AMBULATORY_CARE_PROVIDER_SITE_OTHER): Payer: Self-pay | Admitting: Nurse Practitioner

## 2019-06-20 ENCOUNTER — Other Ambulatory Visit: Payer: Self-pay

## 2019-06-20 VITALS — BP 140/84 | HR 55 | Ht 62.0 in | Wt 350.0 lb

## 2019-06-20 DIAGNOSIS — I493 Ventricular premature depolarization: Secondary | ICD-10-CM | POA: Insufficient documentation

## 2019-06-20 DIAGNOSIS — E785 Hyperlipidemia, unspecified: Secondary | ICD-10-CM | POA: Diagnosis not present

## 2019-06-20 DIAGNOSIS — I150 Renovascular hypertension: Secondary | ICD-10-CM

## 2019-06-20 DIAGNOSIS — I701 Atherosclerosis of renal artery: Secondary | ICD-10-CM

## 2019-06-20 DIAGNOSIS — Z8639 Personal history of other endocrine, nutritional and metabolic disease: Secondary | ICD-10-CM | POA: Insufficient documentation

## 2019-06-20 DIAGNOSIS — I2510 Atherosclerotic heart disease of native coronary artery without angina pectoris: Secondary | ICD-10-CM | POA: Insufficient documentation

## 2019-06-21 ENCOUNTER — Encounter (INDEPENDENT_AMBULATORY_CARE_PROVIDER_SITE_OTHER): Payer: Self-pay | Admitting: Nurse Practitioner

## 2019-06-21 NOTE — Progress Notes (Signed)
Subjective:    Patient ID: Jacqueline Parks, female    DOB: 28-Oct-1970, 49 y.o.   MRN: [id number] Chief Complaint  Patient presents with  . Follow-up    U/S Follow up    The patient returns to the office for followup and review of the noninvasive studies regarding renal vascular hypertension and renal artery stenosis. There have been no interval changes in the patient's blood pressure control.  The patient has been a little nervous about her stent placement due to the fact that her cardiologist recently took her off of Plavix.  The patient denies headache or flushing.  No flank or unusual back pain.    Patient originally had her stent placed in 2014 with reintervention in 2017.  There have been no significant changes to the patient's overall health care.  No interval shortening of the patient's walking distance or new symptoms consistent with claudication.  The patient denies the  development of rest pain symptoms. No new ulcers or wounds have occurred since the last visit.  The patient denies amaurosis fugax or recent TIA symptoms. There are no recent neurological changes noted. The patient denies history of DVT, PE or superficial thrombophlebitis. The patient denies recent episodes of angina or shortness of breath.   Duplex ultrasound reveals normal size right kidney with no evidence of renal artery stenosis.  The left kidney is known to be atrophied.   Review of Systems  All other systems reviewed and are negative.      Objective:   Physical Exam Vitals reviewed.  HENT:     Head: Normocephalic.  Cardiovascular:     Rate and Rhythm: Normal rate and regular rhythm.     Pulses: Normal pulses.  Pulmonary:     Effort: Pulmonary effort is normal.  Skin:    Capillary Refill: Capillary refill takes less than 2 seconds.  Neurological:     Mental Status: She is alert.  Psychiatric:        Mood and Affect: Mood normal.        Behavior: Behavior normal.        Thought Content:  Thought content normal.        Judgment: Judgment normal.     BP 140/84   Pulse (!) 55   Ht 5\' 2"  (1.575 m)   Wt (!) 350 lb (158.8 kg)   BMI 64.02 kg/m   Past Medical History:  Diagnosis Date  . Arthritis   . Asthma   . Chronic kidney disease   . Hypertension   . Kidney failure   . Myocardial infarction [facility])     Social History   Socioeconomic History  . Marital status: Married    Spouse name: Not on file  . Number of children: Not on file  . Years of education: Not on file  . Highest education level: Not on file  Occupational History  . Not on file  Tobacco Use  . Smoking status: Former [facility]  . Smokeless tobacco: Never Used  Substance and Sexual Activity  . Alcohol use: No  . Drug use: No  . Sexual activity: Not on file  Other Topics Concern  . Not on file  Social History Narrative  . Not on file   Social Determinants of Health   Financial Resource Strain:   . Difficulty of Paying Living Expenses:   Food Insecurity:   . Worried About Games developer in the Last Year:   . Programme researcher, broadcasting/film/video of Food in the  Last Year:   Transportation Needs:   . Freight forwarder (Medical):   Marland Kitchen Lack of Transportation (Non-Medical):   Physical Activity:   . Days of Exercise per Week:   . Minutes of Exercise per Session:   Stress:   . Feeling of Stress :   Social Connections:   . Frequency of Communication with Friends and Family:   . Frequency of Social Gatherings with Friends and Family:   . Attends Religious Services:   . Active Member of Clubs or Organizations:   . Attends Banker Meetings:   Marland Kitchen Marital Status:   Intimate Partner Violence:   . Fear of Current or Ex-Partner:   . Emotionally Abused:   Marland Kitchen Physically Abused:   . Sexually Abused:     Past Surgical History:  Procedure Laterality Date  . ABDOMINAL HYSTERECTOMY    . APPENDECTOMY    . CESAREAN SECTION    . PERIPHERAL VASCULAR CATHETERIZATION N/A 02/08/2016   Procedure: Renal Angiography;   Surgeon: Annice Needy, MD;  Location: [facility] INVASIVE CV LAB;  Service: Cardiovascular;  Laterality: N/A;  . RENAL ARTERY STENT Right     Family History  Problem Relation Age of Onset  . COPD Mother   . Heart disease Father     Allergies  Allergen Reactions  . Nitroglycerin Other (See Comments)    Lowers BP  . Proventil [Albuterol] Palpitations and Other (See Comments)    Tolerates PROAIR Inhaler (patient is allergic to BRAND NAME PROVENTIL DUE TO BLEND)  . Soap Rash    Dial soap causes a blistery rash.       Assessment & Plan:   1. Renal artery stenosis (HCC) Recommend:  The patient has evidence of atherosclerotic changes of the renal artery.  At this time the patient's blood pressure is fairly well controlled.  Patient does not need angiography of the renal artery given the good control of his hypertension.    However, if at any point the patient's BP becomes acutely worse then intervention would be strongly encouraged.  Instead of the annual duplex that we do for the patient's renal arteries we will actually repeat her studies in 6 months.  This is due to the recent change in Plavix and ensuring that there is no early stenosis. 2. Hyperlipidemia, unspecified hyperlipidemia type Good lipid control is important to slowly atherosclerotic disease progression.  Patient is on appropriate medications.  No changes made today.  3. Renovascular hypertension Patient has good blood pressure today.  Patient also reports having good blood pressures consistently.  Patient will remain on current medications no changes made today. Current Outpatient Medications on File Prior to Visit  Medication Sig Dispense Refill  . amLODipine (NORVASC) 10 MG tablet Take 10 mg by mouth daily. In am.    . carvedilol (COREG) 25 MG tablet Take 25 mg by mouth 2 (two) times daily.     . Fluticasone-Salmeterol (ADVAIR) 250-50 MCG/DOSE AEPB Inhale 1 puff into the lungs 2 (two) times daily.    Marland Kitchen lisinopril  (PRINIVIL,ZESTRIL) 40 MG tablet Take 40 mg by mouth every evening.     . loratadine (CLARITIN) 10 MG tablet Take 10 mg by mouth daily. In am.  11  . Tiotropium Bromide Monohydrate (SPIRIVA RESPIMAT) 1.25 MCG/ACT AERS Spiriva Respimat 2.5 mcg/actuation solution for inhalation  Inhale 2 puffs every day by inhalation route.    Marland Kitchen acetaminophen (TYLENOL) 500 MG tablet Take by mouth.    Marland Kitchen atorvastatin (LIPITOR) 80 MG tablet  Take by mouth.    . chlorthalidone (HYGROTON) 25 MG tablet Take 25 mg by mouth daily.  1  . clopidogrel (PLAVIX) 75 MG tablet Take 1 tablet (75 mg total) by mouth daily. (Patient not taking: Reported on 06/20/2019) 30 tablet 11  . diclofenac sodium (VOLTAREN) 1 % GEL Apply topically.    . hydrALAZINE (APRESOLINE) 25 MG tablet Take 25 mg by mouth 2 (two) times daily.  2  . nitroGLYCERIN (NITROSTAT) 0.4 MG SL tablet PLACE 1 TABLET UNDER TONGUE EVERY 5 MINUTES AS  NEEDED FOR CHEST PAIN. MAY REPEAT UP TO 3 TIMES THEN CALL 911.  1  . omeprazole (PRILOSEC) 40 MG capsule Take by mouth.    . phentermine 15 MG capsule Take by mouth.    Marland Kitchen PROAIR HFA 108 (90 Base) MCG/ACT inhaler Inhale 1-2 puffs into the lungs every 6 (six) hours as needed for wheezing or shortness of breath.    . ranitidine (ZANTAC) 150 MG tablet Take by mouth.    . spironolactone (ALDACTONE) 100 MG tablet Take 100 mg by mouth daily. In am.    . Tiotropium Bromide Monohydrate (SPIRIVA RESPIMAT) 2.5 MCG/ACT AERS Inhale into the lungs.    . topiramate (TOPAMAX) 25 MG tablet Take by mouth.    . Vitamin D, Ergocalciferol, (DRISDOL) 50000 units CAPS capsule Take by mouth.     No current facility-administered medications on file prior to visit.    There are no Patient Instructions on file for this visit. Return in about 6 months (around 12/20/2019) for Renal stenosis.   Kris Hartmann, NP

## 2019-12-23 ENCOUNTER — Encounter (INDEPENDENT_AMBULATORY_CARE_PROVIDER_SITE_OTHER): Payer: Medicaid Other

## 2019-12-23 ENCOUNTER — Ambulatory Visit (INDEPENDENT_AMBULATORY_CARE_PROVIDER_SITE_OTHER): Payer: Medicaid Other | Admitting: Vascular Surgery

## 2020-01-18 ENCOUNTER — Other Ambulatory Visit (INDEPENDENT_AMBULATORY_CARE_PROVIDER_SITE_OTHER): Payer: Self-pay | Admitting: Nurse Practitioner

## 2020-01-18 DIAGNOSIS — Z9889 Other specified postprocedural states: Secondary | ICD-10-CM

## 2020-01-18 DIAGNOSIS — N280 Ischemia and infarction of kidney: Secondary | ICD-10-CM

## 2020-01-20 ENCOUNTER — Encounter (INDEPENDENT_AMBULATORY_CARE_PROVIDER_SITE_OTHER): Payer: Self-pay | Admitting: Vascular Surgery

## 2020-01-20 ENCOUNTER — Ambulatory Visit (INDEPENDENT_AMBULATORY_CARE_PROVIDER_SITE_OTHER): Payer: Medicaid Other | Admitting: Vascular Surgery

## 2020-01-20 ENCOUNTER — Other Ambulatory Visit: Payer: Self-pay

## 2020-01-20 ENCOUNTER — Ambulatory Visit (INDEPENDENT_AMBULATORY_CARE_PROVIDER_SITE_OTHER): Payer: Medicaid Other

## 2020-01-20 VITALS — BP 122/79 | HR 68 | Resp 16 | Wt 364.0 lb

## 2020-01-20 DIAGNOSIS — N1832 Chronic kidney disease, stage 3b: Secondary | ICD-10-CM | POA: Diagnosis not present

## 2020-01-20 DIAGNOSIS — I701 Atherosclerosis of renal artery: Secondary | ICD-10-CM | POA: Diagnosis not present

## 2020-01-20 DIAGNOSIS — N280 Ischemia and infarction of kidney: Secondary | ICD-10-CM | POA: Diagnosis not present

## 2020-01-20 DIAGNOSIS — Z9889 Other specified postprocedural states: Secondary | ICD-10-CM | POA: Diagnosis not present

## 2020-01-20 DIAGNOSIS — I150 Renovascular hypertension: Secondary | ICD-10-CM | POA: Diagnosis not present

## 2020-01-20 DIAGNOSIS — K219 Gastro-esophageal reflux disease without esophagitis: Secondary | ICD-10-CM | POA: Insufficient documentation

## 2020-01-20 NOTE — Progress Notes (Signed)
MRN : [id number]  Jacqueline Parks is a 49 y.o. (Jul 01, 1970) female who presents with chief complaint of  Chief Complaint  Patient presents with  . Follow-up    35month ultrasound follow up  .  History of Present Illness: Patient returns today in follow up of renal artery stenosis.  Almost 4 years ago, she underwent an mention for a solitary right kidney with a high-grade stenosis with severe hypertension and chronic kidney disease.  She has done really well since that time.  Her creatinine clearance has been stable in the 30s.  Her blood pressure has been markedly improved.  No major changes from her point of view.  She did lose her husband earlier this year but she is fighting through this.  Her duplex today shows her right renal artery stent to be widely patent with no recurrent stenosis and a solitary right kidney.  Current Outpatient Medications  Medication Sig Dispense Refill  . acetaminophen-codeine (TYLENOL #3) 300-30 MG tablet Take 1 tablet by mouth every 4 (four) hours as needed.    Marland Kitchen amLODipine (NORVASC) 5 MG tablet Take 5 mg by mouth daily.    . carvedilol (COREG) 25 MG tablet Take 25 mg by mouth 2 (two) times daily.     . chlorthalidone (HYGROTON) 25 MG tablet Take 25 mg by mouth daily.  1  . diclofenac sodium (VOLTAREN) 1 % GEL Apply topically.    . famotidine (PEPCID) 40 MG tablet Take 40 mg by mouth at bedtime.    . famotidine (PEPCID) 40 MG tablet famotidine 40 mg tablet  TAKE 1 TABLET BY MOUTH EVERY NIGHT AT BEDTIME    . Fluticasone-Salmeterol (ADVAIR) 250-50 MCG/DOSE AEPB Inhale 1 puff into the lungs 2 (two) times daily.    Marland Kitchen gabapentin (NEURONTIN) 100 MG capsule Take 200 mg by mouth at bedtime.    . hydrALAZINE (APRESOLINE) 25 MG tablet Take 25 mg by mouth 2 (two) times daily.  2  . lisinopril (PRINIVIL,ZESTRIL) 40 MG tablet Take 40 mg by mouth every evening.     . loratadine (CLARITIN) 10 MG tablet Take 10 mg by mouth daily. In am.  11  . nitroGLYCERIN (NITROSTAT) 0.4  MG SL tablet PLACE 1 TABLET UNDER TONGUE EVERY 5 MINUTES AS  NEEDED FOR CHEST PAIN. MAY REPEAT UP TO 3 TIMES THEN CALL 911.  1  . omeprazole (PRILOSEC) 40 MG capsule Take by mouth.    Marland Kitchen PROAIR HFA 108 (90 Base) MCG/ACT inhaler Inhale 1-2 puffs into the lungs every 6 (six) hours as needed for wheezing or shortness of breath.    . spironolactone (ALDACTONE) 100 MG tablet Take 100 mg by mouth daily. In am.    . Tiotropium Bromide Monohydrate (SPIRIVA RESPIMAT) 1.25 MCG/ACT AERS Spiriva Respimat 2.5 mcg/actuation solution for inhalation  Inhale 2 puffs every day by inhalation route.    . Tiotropium Bromide Monohydrate (SPIRIVA RESPIMAT) 2.5 MCG/ACT AERS Inhale into the lungs.    . Vitamin D, Ergocalciferol, (DRISDOL) 50000 units CAPS capsule Take by mouth.    Marland Kitchen acetaminophen (TYLENOL) 500 MG tablet Take by mouth. (Patient not taking: Reported on 01/20/2020)    . amLODipine (NORVASC) 10 MG tablet Take 10 mg by mouth daily. In am. (Patient not taking: Reported on 01/20/2020)    . atorvastatin (LIPITOR) 80 MG tablet Take by mouth.    . clopidogrel (PLAVIX) 75 MG tablet Take 1 tablet (75 mg total) by mouth daily. (Patient not taking: Reported on 06/20/2019) 30 tablet 11  .  phentermine 15 MG capsule Take by mouth.    . ranitidine (ZANTAC) 150 MG tablet Take by mouth. (Patient not taking: Reported on 01/20/2020)    . topiramate (TOPAMAX) 25 MG tablet Take by mouth.     No current facility-administered medications for this visit.    Past Medical History:  Diagnosis Date  . Arthritis   . Asthma   . Chronic kidney disease   . Hypertension   . Kidney failure   . Myocardial infarction [facility])     Past Surgical History:  Procedure Laterality Date  . ABDOMINAL HYSTERECTOMY    . APPENDECTOMY    . CESAREAN SECTION    . PERIPHERAL VASCULAR CATHETERIZATION N/A 02/08/2016   Procedure: Renal Angiography;  Surgeon: Annice Needy, MD;  Location: [facility] INVASIVE CV LAB;  Service: Cardiovascular;  Laterality: N/A;    . RENAL ARTERY STENT Right      Social History   Tobacco Use  . Smoking status: Former Games developer  . Smokeless tobacco: Never Used  Substance Use Topics  . Alcohol use: No  . Drug use: No      Family History  Problem Relation Age of Onset  . COPD Mother   . Heart disease Father     Allergies  Allergen Reactions  . Nitroglycerin Other (See Comments)    Lowers BP  . Proventil [Albuterol] Palpitations and Other (See Comments)    Tolerates PROAIR Inhaler (patient is allergic to BRAND NAME PROVENTIL DUE TO BLEND)  . Soap Rash    Dial soap causes a blistery rash.     REVIEW OF SYSTEMS (Negative unless checked)  Constitutional: [] Weight loss  [] Fever  [] Chills Cardiac: [] Chest pain   [] Chest pressure   [] Palpitations   [] Shortness of breath when laying flat   [] Shortness of breath at rest   [] Shortness of breath with exertion. Vascular:  [] Pain in legs with walking   [] Pain in legs at rest   [] Pain in legs when laying flat   [] Claudication   [] Pain in feet when walking  [] Pain in feet at rest  [] Pain in feet when laying flat   [] History of DVT   [] Phlebitis   [x] Swelling in legs   [] Varicose veins   [] Non-healing ulcers Pulmonary:   [] Uses home oxygen   [] Productive cough   [] Hemoptysis   [] Wheeze  [] COPD   [] Asthma Neurologic:  [] Dizziness  [] Blackouts   [] Seizures   [] History of stroke   [] History of TIA  [] Aphasia   [] Temporary blindness   [] Dysphagia   [] Weakness or numbness in arms   [] Weakness or numbness in legs Musculoskeletal:  [x] Arthritis   [] Joint swelling   [] Joint pain   [] Low back pain Hematologic:  [] Easy bruising  [] Easy bleeding   [] Hypercoagulable state   [] Anemic   Gastrointestinal:  [] Blood in stool   [] Vomiting blood  [] Gastroesophageal reflux/heartburn   [] Abdominal pain Genitourinary:  [x] Chronic kidney disease   [] Difficult urination  [] Frequent urination  [] Burning with urination   [] Hematuria Skin:  [] Rashes   [] Ulcers   [] Wounds Psychological:   [] History of anxiety   []  History of major depression.  Physical Examination  BP 122/79 (BP Location: Right Arm)   Pulse 68   Resp 16   Wt (!) 364 lb (165.1 kg)   BMI 66.58 kg/m  Gen:  WD/WN, NAD. obese Head: [place]/AT, No temporalis wasting. Ear/Nose/Throat: Hearing grossly intact, nares w/o erythema or drainage Eyes: Conjunctiva clear. Sclera non-icteric Neck: Supple.  Trachea midline Pulmonary:  Good air  movement, no use of accessory muscles.  Cardiac: RRR, no JVD Vascular:  Vessel Right Left  Radial Palpable Palpable               Musculoskeletal: M/S 5/5 throughout.  No deformity or atrophy. Mild LE edema. Neurologic: Sensation grossly intact in extremities.  Symmetrical.  Speech is fluent.  Psychiatric: Judgment intact, Mood & affect appropriate for pt's clinical situation. Dermatologic: No rashes or ulcers noted.  No cellulitis or open wounds.       Labs No results found for this or any previous visit (from the past 2160 hour(s)).  Radiology No results found.  Assessment/Plan CKD (chronic kidney disease) stage 3, GFR 30-59 ml/min Stable per her report.  Given her solitary kidney and renal artery stenosis, she will be at high risk for deteriorating renal function.  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  Clinically doing well.  We will continue 29-month follow-up intervals because of the high risk nature of a solitary kidney with previous intervention.    Festus Barren, MD  01/20/2020 8:30 AM    This note was created with Dragon medical transcription system.  Any errors from dictation are purely unintentional

## 2020-07-17 ENCOUNTER — Encounter (INDEPENDENT_AMBULATORY_CARE_PROVIDER_SITE_OTHER): Payer: Self-pay | Admitting: Vascular Surgery

## 2020-07-17 ENCOUNTER — Ambulatory Visit (INDEPENDENT_AMBULATORY_CARE_PROVIDER_SITE_OTHER): Payer: Medicaid Other | Admitting: Vascular Surgery

## 2020-07-17 ENCOUNTER — Ambulatory Visit (INDEPENDENT_AMBULATORY_CARE_PROVIDER_SITE_OTHER): Payer: Medicaid Other

## 2020-07-17 ENCOUNTER — Other Ambulatory Visit: Payer: Self-pay

## 2020-07-17 VITALS — BP 183/84 | HR 71 | Resp 16 | Wt 358.2 lb

## 2020-07-17 DIAGNOSIS — I150 Renovascular hypertension: Secondary | ICD-10-CM

## 2020-07-17 DIAGNOSIS — N1832 Chronic kidney disease, stage 3b: Secondary | ICD-10-CM

## 2020-07-17 DIAGNOSIS — I701 Atherosclerosis of renal artery: Secondary | ICD-10-CM

## 2020-07-17 NOTE — Progress Notes (Signed)
MRN : [id number]  Jacqueline Parks is a 50 y.o. (29-May-1970) female who presents with chief complaint of  Chief Complaint  Patient presents with  . Follow-up    6 month ultrasound follow up  .  History of Present Illness: Patient returns today in follow up of her renal artery stenosis.  Her blood pressure has been quite good and her renal function has been stable.  She said her GFR recently was up to 50.  She has a solitary kidney that has required 2 previous interventions but none in the last 4 years or so.  Duplex today shows no hemodynamically significant and her solitary right renal artery status post previous intervention.  Current Outpatient Medications  Medication Sig Dispense Refill  . acetaminophen-codeine (TYLENOL #3) 300-30 MG tablet Take 1 tablet by mouth every 4 (four) hours as needed.    Marland Kitchen allopurinol (ZYLOPRIM) 100 MG tablet allopurinol 100 mg tablet  daily in morning    . amLODipine (NORVASC) 5 MG tablet Take 5 mg by mouth daily.    . carvedilol (COREG) 25 MG tablet Take 25 mg by mouth 2 (two) times daily.     . chlorthalidone (HYGROTON) 25 MG tablet Take 25 mg by mouth daily.  1  . diclofenac sodium (VOLTAREN) 1 % GEL Apply topically.    . famotidine (PEPCID) 40 MG tablet Take 40 mg by mouth at bedtime.    . famotidine (PEPCID) 40 MG tablet famotidine 40 mg tablet  TAKE 1 TABLET BY MOUTH EVERY NIGHT AT BEDTIME    . Fluticasone-Salmeterol (ADVAIR) 250-50 MCG/DOSE AEPB Inhale 1 puff into the lungs 2 (two) times daily.    Marland Kitchen gabapentin (NEURONTIN) 100 MG capsule Take 200 mg by mouth at bedtime.    . hydrALAZINE (APRESOLINE) 25 MG tablet Take 25 mg by mouth 2 (two) times daily.  2  . lisinopril (PRINIVIL,ZESTRIL) 40 MG tablet Take 40 mg by mouth every evening.     . loratadine (CLARITIN) 10 MG tablet Take 10 mg by mouth daily. In am.  11  . nitroGLYCERIN (NITROSTAT) 0.4 MG SL tablet PLACE 1 TABLET UNDER TONGUE EVERY 5 MINUTES AS  NEEDED FOR CHEST PAIN. MAY REPEAT UP TO 3  TIMES THEN CALL 911.  1  . omeprazole (PRILOSEC) 40 MG capsule Take by mouth.    Marland Kitchen PROAIR HFA 108 (90 Base) MCG/ACT inhaler Inhale 1-2 puffs into the lungs every 6 (six) hours as needed for wheezing or shortness of breath.    . spironolactone (ALDACTONE) 100 MG tablet Take 100 mg by mouth daily. In am.    . Tiotropium Bromide Monohydrate (SPIRIVA RESPIMAT) 1.25 MCG/ACT AERS Spiriva Respimat 2.5 mcg/actuation solution for inhalation  Inhale 2 puffs every day by inhalation route.    . Tiotropium Bromide Monohydrate 2.5 MCG/ACT AERS Inhale into the lungs.    . Vitamin D, Ergocalciferol, (DRISDOL) 50000 units CAPS capsule Take by mouth.    Marland Kitchen acetaminophen (TYLENOL) 500 MG tablet Take by mouth. (Patient not taking: No sig reported)    . amLODipine (NORVASC) 10 MG tablet Take 10 mg by mouth daily. In am. (Patient not taking: No sig reported)    . atorvastatin (LIPITOR) 80 MG tablet Take by mouth.    . clopidogrel (PLAVIX) 75 MG tablet Take 1 tablet (75 mg total) by mouth daily. (Patient not taking: No sig reported) 30 tablet 11  . phentermine 15 MG capsule Take by mouth.    . ranitidine (ZANTAC) 150 MG tablet Take  by mouth. (Patient not taking: No sig reported)    . topiramate (TOPAMAX) 25 MG tablet Take by mouth.     No current facility-administered medications for this visit.    Past Medical History:  Diagnosis Date  . Arthritis   . Asthma   . Chronic kidney disease   . Hypertension   . Kidney failure   . Myocardial infarction [facility])     Past Surgical History:  Procedure Laterality Date  . ABDOMINAL HYSTERECTOMY    . APPENDECTOMY    . CESAREAN SECTION    . PERIPHERAL VASCULAR CATHETERIZATION N/A 02/08/2016   Procedure: Renal Angiography;  Surgeon: Annice Needy, MD;  Location: [facility] INVASIVE CV LAB;  Service: Cardiovascular;  Laterality: N/A;  . RENAL ARTERY STENT Right      Social History   Tobacco Use  . Smoking status: Former Games developer  . Smokeless tobacco: Never Used  Substance  Use Topics  . Alcohol use: No  . Drug use: No      Family History  Problem Relation Age of Onset  . COPD Mother   . Heart disease Father     Allergies  Allergen Reactions  . Nitroglycerin Other (See Comments)    Lowers BP Other reaction(s): bp drops  . Albuterol Palpitations and Other (See Comments)    Tolerates PROAIR Inhaler (patient is allergic to BRAND NAME PROVENTIL DUE TO BLEND) Other reaction(s): heart palpatations  . Soap Rash    Dial soap causes a blistery rash.     REVIEW OF SYSTEMS (Negative unless checked)  Constitutional: [] Weight loss  [] Fever  [] Chills Cardiac: [] Chest pain   [] Chest pressure   [] Palpitations   [] Shortness of breath when laying flat   [] Shortness of breath at rest   [] Shortness of breath with exertion. Vascular:  [] Pain in legs with walking   [] Pain in legs at rest   [] Pain in legs when laying flat   [] Claudication   [] Pain in feet when walking  [] Pain in feet at rest  [] Pain in feet when laying flat   [] History of DVT   [] Phlebitis   [] Swelling in legs   [] Varicose veins   [] Non-healing ulcers Pulmonary:   [] Uses home oxygen   [] Productive cough   [] Hemoptysis   [] Wheeze  [] COPD   [] Asthma Neurologic:  [] Dizziness  [] Blackouts   [] Seizures   [] History of stroke   [] History of TIA  [] Aphasia   [] Temporary blindness   [] Dysphagia   [] Weakness or numbness in arms   [] Weakness or numbness in legs Musculoskeletal:  [x] Arthritis   [] Joint swelling   [] Joint pain   [] Low back pain Hematologic:  [] Easy bruising  [] Easy bleeding   [] Hypercoagulable state   [] Anemic   Gastrointestinal:  [] Blood in stool   [] Vomiting blood  [] Gastroesophageal reflux/heartburn   [] Abdominal pain Genitourinary:  [x] Chronic kidney disease   [] Difficult urination  [] Frequent urination  [] Burning with urination   [] Hematuria Skin:  [] Rashes   [] Ulcers   [] Wounds Psychological:  [] History of anxiety   []  History of major depression.  Physical Examination  BP (!) 183/84 (BP  Location: Right Arm)   Pulse 71   Resp 16   Wt (!) 358 lb 3.2 oz (162.5 kg)   BMI 65.52 kg/m  Gen:  WD/WN, NAD.  Obese Head: [place]/AT, No temporalis wasting. Ear/Nose/Throat: Hearing grossly intact, nares w/o erythema or drainage Eyes: Conjunctiva clear. Sclera non-icteric Neck: Supple.  Trachea midline Pulmonary:  Good air movement, no use of accessory muscles.  Cardiac: RRR, no JVD Vascular:  Vessel Right Left  Radial Palpable Palpable               Musculoskeletal: M/S 5/5 throughout.  No deformity or atrophy.  Mild edema. Neurologic: Sensation grossly intact in extremities.  Symmetrical.  Speech is fluent.  Psychiatric: Judgment intact, Mood & affect appropriate for pt's clinical situation. Dermatologic: No rashes or ulcers noted.  No cellulitis or open wounds.       Labs No results found for this or any previous visit (from the past 2160 hour(s)).  Radiology No results found.  Assessment/Plan CKD (chronic kidney disease) stage 3, GFR 30-59 ml/min Stable per her report. Given her solitary kidney and renal artery stenosis, she will be at high risk for deteriorating renal function.  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  Clinically doing well.  We will continue 5-month follow-up intervals because of the high risk nature of a solitary kidney with previous intervention.   Festus Barren, MD  07/17/2020 8:47 AM    This note was created with Dragon medical transcription system.  Any errors from dictation are purely unintentional

## 2020-09-13 DIAGNOSIS — M1250 Traumatic arthropathy, unspecified site: Secondary | ICD-10-CM | POA: Insufficient documentation

## 2020-12-17 DIAGNOSIS — J4530 Mild persistent asthma, uncomplicated: Secondary | ICD-10-CM | POA: Insufficient documentation

## 2021-01-15 ENCOUNTER — Ambulatory Visit (INDEPENDENT_AMBULATORY_CARE_PROVIDER_SITE_OTHER): Payer: Medicaid Other

## 2021-01-15 ENCOUNTER — Other Ambulatory Visit: Payer: Self-pay

## 2021-01-15 ENCOUNTER — Encounter (INDEPENDENT_AMBULATORY_CARE_PROVIDER_SITE_OTHER): Payer: Self-pay | Admitting: Vascular Surgery

## 2021-01-15 ENCOUNTER — Ambulatory Visit (INDEPENDENT_AMBULATORY_CARE_PROVIDER_SITE_OTHER): Payer: Medicaid Other | Admitting: Vascular Surgery

## 2021-01-15 VITALS — BP 137/80 | HR 57 | Ht 62.0 in | Wt 330.0 lb

## 2021-01-15 DIAGNOSIS — N1832 Chronic kidney disease, stage 3b: Secondary | ICD-10-CM

## 2021-01-15 DIAGNOSIS — I701 Atherosclerosis of renal artery: Secondary | ICD-10-CM

## 2021-01-15 DIAGNOSIS — E785 Hyperlipidemia, unspecified: Secondary | ICD-10-CM | POA: Diagnosis not present

## 2021-01-15 DIAGNOSIS — I150 Renovascular hypertension: Secondary | ICD-10-CM

## 2021-01-15 NOTE — Progress Notes (Signed)
MRN : [id number]  Jacqueline Parks is a 50 y.o. (19-Jun-1970) female who presents with chief complaint of  Chief Complaint  Patient presents with   Follow-up    6 Mo renal U/S   .  History of Present Illness: Patient returns today in follow up of her renal artery stenosis.  She is doing well.  Her renal function has fluctuated but remains in stage III.  Her blood pressure has been generally pretty good.  No new complaints today.  Her renal artery duplex shows a patent right renal artery stent without recurrent stenosis of the solitary right kidney.  Current Outpatient Medications  Medication Sig Dispense Refill   acetaminophen (TYLENOL) 500 MG tablet Take by mouth.     acetaminophen-codeine (TYLENOL #3) 300-30 MG tablet Take 1 tablet by mouth every 4 (four) hours as needed.     allopurinol (ZYLOPRIM) 100 MG tablet allopurinol 100 mg tablet  daily in morning     amLODipine (NORVASC) 10 MG tablet Take 10 mg by mouth daily. In am.     amLODipine (NORVASC) 5 MG tablet Take 5 mg by mouth daily.     benzonatate (TESSALON) 200 MG capsule Take by mouth.     carvedilol (COREG) 25 MG tablet Take 25 mg by mouth 2 (two) times daily.      chlorthalidone (HYGROTON) 25 MG tablet Take 25 mg by mouth daily.  1   clopidogrel (PLAVIX) 75 MG tablet Take 1 tablet (75 mg total) by mouth daily. 30 tablet 11   diclofenac sodium (VOLTAREN) 1 % GEL Apply topically.     famotidine (PEPCID) 40 MG tablet Take 40 mg by mouth at bedtime.     famotidine (PEPCID) 40 MG tablet famotidine 40 mg tablet  TAKE 1 TABLET BY MOUTH EVERY NIGHT AT BEDTIME     Fluticasone-Salmeterol (ADVAIR) 250-50 MCG/DOSE AEPB Inhale 1 puff into the lungs 2 (two) times daily.     gabapentin (NEURONTIN) 100 MG capsule Take 200 mg by mouth at bedtime.     hydrALAZINE (APRESOLINE) 25 MG tablet Take 25 mg by mouth 2 (two) times daily.  2   lisinopril (PRINIVIL,ZESTRIL) 40 MG tablet Take 40 mg by mouth every evening.      loratadine (CLARITIN) 10  MG tablet Take 10 mg by mouth daily. In am.  11   nitroGLYCERIN (NITROSTAT) 0.4 MG SL tablet PLACE 1 TABLET UNDER TONGUE EVERY 5 MINUTES AS  NEEDED FOR CHEST PAIN. MAY REPEAT UP TO 3 TIMES THEN CALL 911.  1   omeprazole (PRILOSEC) 40 MG capsule Take by mouth.     PROAIR HFA 108 (90 Base) MCG/ACT inhaler Inhale 1-2 puffs into the lungs every 6 (six) hours as needed for wheezing or shortness of breath.     ranitidine (ZANTAC) 150 MG tablet Take by mouth.     spironolactone (ALDACTONE) 100 MG tablet Take 100 mg by mouth daily. In am.     Tiotropium Bromide Monohydrate (SPIRIVA RESPIMAT) 1.25 MCG/ACT AERS Spiriva Respimat 2.5 mcg/actuation solution for inhalation  Inhale 2 puffs every day by inhalation route.     Tiotropium Bromide Monohydrate 2.5 MCG/ACT AERS Inhale into the lungs.     Vitamin D, Ergocalciferol, (DRISDOL) 50000 units CAPS capsule Take by mouth.     atorvastatin (LIPITOR) 80 MG tablet Take by mouth.     phentermine 15 MG capsule Take by mouth.     topiramate (TOPAMAX) 25 MG tablet Take by mouth.     No  current facility-administered medications for this visit.    Past Medical History:  Diagnosis Date   Arthritis    Asthma    Chronic kidney disease    Hypertension    Kidney failure    Myocardial infarction [facility])     Past Surgical History:  Procedure Laterality Date   ABDOMINAL HYSTERECTOMY     APPENDECTOMY     CESAREAN SECTION     PERIPHERAL VASCULAR CATHETERIZATION N/A 02/08/2016   Procedure: Renal Angiography;  Surgeon: Algernon Huxley, MD;  Location: [facility];  Service: Cardiovascular;  Laterality: N/A;   RENAL ARTERY STENT Right      Social History   Tobacco Use   Smoking status: Former   Smokeless tobacco: Never  Substance Use Topics   Alcohol use: No   Drug use: No      Family History  Problem Relation Age of Onset   COPD Mother    Heart disease Father      Allergies  Allergen Reactions   Nitroglycerin Other (See Comments)    Lowers  BP Other reaction(s): bp drops   Albuterol Palpitations and Other (See Comments)    Tolerates PROAIR Inhaler (patient is allergic to BRAND NAME PROVENTIL DUE TO BLEND) Other reaction(s): heart palpatations   Soap Rash    Dial soap causes a blistery rash.     REVIEW OF SYSTEMS (Negative unless checked)  Constitutional: [] Weight loss  [] Fever  [] Chills Cardiac: [] Chest pain   [] Chest pressure   [] Palpitations   [] Shortness of breath when laying flat   [] Shortness of breath at rest   [] Shortness of breath with exertion. Vascular:  [] Pain in legs with walking   [] Pain in legs at rest   [] Pain in legs when laying flat   [] Claudication   [] Pain in feet when walking  [] Pain in feet at rest  [] Pain in feet when laying flat   [] History of DVT   [] Phlebitis   [x] Swelling in legs   [] Varicose veins   [] Non-healing ulcers Pulmonary:   [] Uses home oxygen   [] Productive cough   [] Hemoptysis   [] Wheeze  [] COPD   [] Asthma Neurologic:  [] Dizziness  [] Blackouts   [] Seizures   [] History of stroke   [] History of TIA  [] Aphasia   [] Temporary blindness   [] Dysphagia   [] Weakness or numbness in arms   [] Weakness or numbness in legs Musculoskeletal:  [x] Arthritis   [] Joint swelling   [] Joint pain   [] Low back pain Hematologic:  [] Easy bruising  [] Easy bleeding   [] Hypercoagulable state   [] Anemic   Gastrointestinal:  [] Blood in stool   [] Vomiting blood  [] Gastroesophageal reflux/heartburn   [] Abdominal pain Genitourinary:  [x] Chronic kidney disease   [] Difficult urination  [] Frequent urination  [] Burning with urination   [] Hematuria Skin:  [] Rashes   [] Ulcers   [] Wounds Psychological:  [] History of anxiety   []  History of major depression.  Physical Examination  BP 137/80   Pulse (!) 57   Ht 5\' 2"  (1.575 m)   Wt (!) 330 lb (149.7 kg)   BMI 60.36 kg/m  Gen:  WD/WN, NAD. Obese. Head: [place]/AT, No temporalis wasting. Ear/Nose/Throat: Hearing grossly intact, nares w/o erythema or drainage Eyes: Conjunctiva  clear. Sclera non-icteric Neck: Supple.  Trachea midline Pulmonary:  Good air movement, no use of accessory muscles.  Cardiac: RRR, no JVD Vascular:  Vessel Right Left  Radial Palpable Palpable  Gastrointestinal: soft, non-tender/non-distended. No guarding/reflex.  Musculoskeletal: M/S 5/5 throughout.  No deformity or atrophy. Mild LE edema. Neurologic: Sensation grossly intact in extremities.  Symmetrical.  Speech is fluent.  Psychiatric: Judgment intact, Mood & affect appropriate for pt's clinical situation. Dermatologic: No rashes or ulcers noted.  No cellulitis or open wounds.      Labs No results found for this or any previous visit (from the past 2160 hour(s)).  Radiology No results found.  Assessment/Plan  Renovascular hypertension BP control much better after intervention.  CKD (chronic kidney disease) stage 3, GFR 30-59 ml/min Follows with neprholgist  Hyperlipidemia lipid control important in reducing the progression of atherosclerotic disease. Continue statin therapy   Renal artery stenosis (HCC) Her renal artery duplex shows a patent right renal artery stent without recurrent stenosis of the solitary right kidney.  She is several years status postinitial renal artery stent placement and then about 5 years status post reintervention for recurrent stenosis.  She is doing well clinically.  Given the high risk scenario for a solitary kidney, we will continue to follow this on 43-month intervals.    Festus Barren, MD  01/15/2021 10:00 AM    This note was created with Dragon medical transcription system.  Any errors from dictation are purely unintentional

## 2021-01-15 NOTE — Assessment & Plan Note (Signed)
lipid control important in reducing the progression of atherosclerotic disease. Continue statin therapy  

## 2021-01-15 NOTE — Assessment & Plan Note (Signed)
Her renal artery duplex shows a patent right renal artery stent without recurrent stenosis of the solitary right kidney.  She is several years status postinitial renal artery stent placement and then about 5 years status post reintervention for recurrent stenosis.  She is doing well clinically.  Given the high risk scenario for a solitary kidney, we will continue to follow this on 6-month intervals.

## 2021-01-15 NOTE — Assessment & Plan Note (Signed)
BP control much better after intervention.

## 2021-01-15 NOTE — Assessment & Plan Note (Signed)
Follows with neprholgist

## 2021-05-13 DIAGNOSIS — I480 Paroxysmal atrial fibrillation: Secondary | ICD-10-CM | POA: Insufficient documentation

## 2021-07-26 ENCOUNTER — Ambulatory Visit (INDEPENDENT_AMBULATORY_CARE_PROVIDER_SITE_OTHER): Payer: 59 | Admitting: Vascular Surgery

## 2021-07-26 ENCOUNTER — Ambulatory Visit (INDEPENDENT_AMBULATORY_CARE_PROVIDER_SITE_OTHER): Payer: 59

## 2021-07-26 ENCOUNTER — Encounter (INDEPENDENT_AMBULATORY_CARE_PROVIDER_SITE_OTHER): Payer: Self-pay | Admitting: Vascular Surgery

## 2021-07-26 VITALS — BP 105/71 | HR 62 | Resp 16 | Wt 333.0 lb

## 2021-07-26 DIAGNOSIS — N1832 Chronic kidney disease, stage 3b: Secondary | ICD-10-CM

## 2021-07-26 DIAGNOSIS — I701 Atherosclerosis of renal artery: Secondary | ICD-10-CM

## 2021-07-26 DIAGNOSIS — E785 Hyperlipidemia, unspecified: Secondary | ICD-10-CM | POA: Diagnosis not present

## 2021-07-26 DIAGNOSIS — I150 Renovascular hypertension: Secondary | ICD-10-CM

## 2021-07-26 NOTE — Progress Notes (Signed)
MRN : [id number]  Jacqueline Parks is a 51 y.o. (Oct 14, 1970) female who presents with chief complaint of  Chief Complaint  Patient presents with   Follow-up    Ultrasound follow up  .  History of Present Illness: Patient returns today in follow up of her renal artery stenosis.  She has a solitary right kidney which we stented and then we intervened on over 5 years ago now.  She has done reasonably well.  She did have an episode where her blood pressure was running higher and they made some medication adjustments which seems to have resolved this.  She denies any fevers or chills.  She thinks her last GFR was in the 45 range and that is about to be rechecked again.  Duplex today shows a patent right renal artery stent without recurrent stenosis in the solitary right kidney  Current Outpatient Medications  Medication Sig Dispense Refill   acetaminophen (TYLENOL) 500 MG tablet Take by mouth.     acetaminophen-codeine (TYLENOL #3) 300-30 MG tablet Take 1 tablet by mouth every 4 (four) hours as needed.     allopurinol (ZYLOPRIM) 100 MG tablet allopurinol 100 mg tablet  daily in morning     amLODipine (NORVASC) 10 MG tablet Take 10 mg by mouth daily. In am.     amLODipine (NORVASC) 10 MG tablet Take 10 mg by mouth daily.     atorvastatin (LIPITOR) 80 MG tablet Take by mouth.     carvedilol (COREG) 25 MG tablet Take 25 mg by mouth 2 (two) times daily.      chlorthalidone (HYGROTON) 25 MG tablet Take 25 mg by mouth daily.  1   clopidogrel (PLAVIX) 75 MG tablet Take 1 tablet (75 mg total) by mouth daily. 30 tablet 11   diclofenac sodium (VOLTAREN) 1 % GEL Apply topically.     famotidine (PEPCID) 40 MG tablet Take 40 mg by mouth at bedtime.     famotidine (PEPCID) 40 MG tablet famotidine 40 mg tablet  TAKE 1 TABLET BY MOUTH EVERY NIGHT AT BEDTIME     Fluticasone-Salmeterol (ADVAIR) 250-50 MCG/DOSE AEPB Inhale 1 puff into the lungs 2 (two) times daily.     furosemide (LASIX) 40 MG tablet Take 40  mg by mouth daily.     gabapentin (NEURONTIN) 100 MG capsule Take 200 mg by mouth at bedtime.     hydrALAZINE (APRESOLINE) 25 MG tablet Take 25 mg by mouth 2 (two) times daily.  2   lisinopril (PRINIVIL,ZESTRIL) 40 MG tablet Take 40 mg by mouth every evening.      loratadine (CLARITIN) 10 MG tablet Take 10 mg by mouth daily. In am.  11   nitroGLYCERIN (NITROSTAT) 0.4 MG SL tablet PLACE 1 TABLET UNDER TONGUE EVERY 5 MINUTES AS  NEEDED FOR CHEST PAIN. MAY REPEAT UP TO 3 TIMES THEN CALL 911.  1   omeprazole (PRILOSEC) 40 MG capsule Take by mouth.     oxybutynin (DITROPAN-XL) 10 MG 24 hr tablet Take 10 mg by mouth daily.     PROAIR HFA 108 (90 Base) MCG/ACT inhaler Inhale 1-2 puffs into the lungs every 6 (six) hours as needed for wheezing or shortness of breath.     Pyridoxine HCl (B-6 PO) Take 800 mg by mouth.     spironolactone (ALDACTONE) 100 MG tablet Take 100 mg by mouth daily. In am.     spironolactone (ALDACTONE) 25 MG tablet Take 25 mg by mouth daily.     Tiotropium Bromide  Monohydrate (SPIRIVA RESPIMAT) 1.25 MCG/ACT AERS Spiriva Respimat 2.5 mcg/actuation solution for inhalation  Inhale 2 puffs every day by inhalation route.     Tiotropium Bromide Monohydrate 2.5 MCG/ACT AERS Inhale into the lungs.     vitamin B-12 (CYANOCOBALAMIN) 1000 MCG tablet Take 1,000 mcg by mouth daily.     Vitamin D, Ergocalciferol, (DRISDOL) 50000 units CAPS capsule Take by mouth.     phentermine 15 MG capsule Take by mouth.     ranitidine (ZANTAC) 150 MG tablet Take by mouth. (Patient not taking: Reported on 07/26/2021)     topiramate (TOPAMAX) 25 MG tablet Take by mouth.     No current facility-administered medications for this visit.    Past Medical History:  Diagnosis Date   Arthritis    Asthma    Chronic kidney disease    Hypertension    Kidney failure    Myocardial infarction [facility])     Past Surgical History:  Procedure Laterality Date   ABDOMINAL HYSTERECTOMY     APPENDECTOMY     CESAREAN  SECTION     PERIPHERAL VASCULAR CATHETERIZATION N/A 02/08/2016   Procedure: Renal Angiography;  Surgeon: Algernon Huxley, MD;  Location: [place] [facility];  Service: Cardiovascular;  Laterality: N/A;   RENAL ARTERY STENT Right      Social History   Tobacco Use   Smoking status: Former   Smokeless tobacco: Never  Substance Use Topics   Alcohol use: No   Drug use: No      Family History  Problem Relation Age of Onset   COPD Mother    Heart disease Father      Allergies  Allergen Reactions   Nitroglycerin Other (See Comments)    Lowers BP Other reaction(s): bp drops   Albuterol Palpitations and Other (See Comments)    Tolerates PROAIR Inhaler (patient is allergic to BRAND NAME PROVENTIL DUE TO BLEND) Other reaction(s): heart palpatations   Soap Rash    Dial soap causes a blistery rash.    REVIEW OF SYSTEMS (Negative unless checked)   Constitutional: [] Weight loss  [] Fever  [] Chills Cardiac: [] Chest pain   [] Chest pressure   [] Palpitations   [] Shortness of breath when laying flat   [] Shortness of breath at rest   [] Shortness of breath with exertion. Vascular:  [] Pain in legs with walking   [] Pain in legs at rest   [] Pain in legs when laying flat   [] Claudication   [] Pain in feet when walking  [] Pain in feet at rest  [] Pain in feet when laying flat   [] History of DVT   [] Phlebitis   [x] Swelling in legs   [] Varicose veins   [] Non-healing ulcers Pulmonary:   [] Uses home oxygen   [] Productive cough   [] Hemoptysis   [] Wheeze  [] COPD   [] Asthma Neurologic:  [] Dizziness  [] Blackouts   [] Seizures   [] History of stroke   [] History of TIA  [] Aphasia   [] Temporary blindness   [] Dysphagia   [] Weakness or numbness in arms   [] Weakness or numbness in legs Musculoskeletal:  [x] Arthritis   [] Joint swelling   [] Joint pain   [] Low back pain Hematologic:  [] Easy bruising  [] Easy bleeding   [] Hypercoagulable state   [] Anemic   Gastrointestinal:  [] Blood in stool   [] Vomiting blood   [] Gastroesophageal reflux/heartburn   [] Abdominal pain Genitourinary:  [x] Chronic kidney disease   [] Difficult urination  [] Frequent urination  [] Burning with urination   [] Hematuria Skin:  [] Rashes   [] Ulcers   [] Wounds Psychological:  []   History of anxiety   []  History of major depression.  Physical Examination  BP 105/71 (BP Location: Right Arm)   Pulse 62   Resp 16   Wt (!) 333 lb (151 kg)   BMI 60.91 kg/m  Gen:  WD/WN, NAD Head: [place]/AT, No temporalis wasting. Ear/Nose/Throat: Hearing grossly intact, nares w/o erythema or drainage Eyes: Conjunctiva clear. Sclera non-icteric Neck: Supple.  Trachea midline Pulmonary:  Good air movement, no use of accessory muscles.  Cardiac: RRR, no JVD Vascular:  Vessel Right Left  Radial Palpable Palpable               Musculoskeletal: M/S 5/5 throughout.  No deformity or atrophy.  Mild lower extremity edema. Neurologic: Sensation grossly intact in extremities.  Symmetrical.  Speech is fluent.  Psychiatric: Judgment intact, Mood & affect appropriate for pt's clinical situation. Dermatologic: No rashes or ulcers noted.  No cellulitis or open wounds.      Labs No results found for this or any previous visit (from the past 2160 hour(s)).  Radiology No results found.  Assessment/Plan Renovascular hypertension BP control much better after intervention.   CKD (chronic kidney disease) stage 3, GFR 30-59 ml/min Follows with neprholgist   Hyperlipidemia lipid control important in reducing the progression of atherosclerotic disease. Continue statin therapy  Renal artery stenosis (HCC) Her renal artery duplex shows a patent right renal artery stent without recurrent stenosis of the solitary right kidney.  She is several years status postinitial renal artery stent placement and then about 5 years status post reintervention for recurrent stenosis.  She is doing well clinically.  Given the high risk scenario for a solitary kidney, we will  continue to follow this on 35-month intervals.   Leotis Pain, MD  07/26/2021 8:57 AM    This note was created with Dragon medical transcription system.  Any errors from dictation are purely unintentional

## 2021-10-14 ENCOUNTER — Telehealth (INDEPENDENT_AMBULATORY_CARE_PROVIDER_SITE_OTHER): Payer: Self-pay

## 2021-10-15 NOTE — Telephone Encounter (Signed)
No she does not.  We typically do not adjust blood pressure medications, and if her bp is lower that is usually an indication that the stent is doing what it is intended to do.  Typically if the stent was blocked we would see a sharp increase in BP, not a drop.  She should keep her appt in november

## 2021-10-15 NOTE — Telephone Encounter (Signed)
Patient was made aware with medical recommendations and verbalized underrstanding

## 2022-01-06 ENCOUNTER — Encounter (INDEPENDENT_AMBULATORY_CARE_PROVIDER_SITE_OTHER): Payer: Self-pay

## 2022-01-28 ENCOUNTER — Ambulatory Visit (INDEPENDENT_AMBULATORY_CARE_PROVIDER_SITE_OTHER): Payer: 59

## 2022-01-28 ENCOUNTER — Encounter (INDEPENDENT_AMBULATORY_CARE_PROVIDER_SITE_OTHER): Payer: Self-pay | Admitting: Vascular Surgery

## 2022-01-28 ENCOUNTER — Ambulatory Visit (INDEPENDENT_AMBULATORY_CARE_PROVIDER_SITE_OTHER): Payer: 59 | Admitting: Vascular Surgery

## 2022-01-28 VITALS — BP 146/87 | HR 55 | Resp 16 | Wt 313.2 lb

## 2022-01-28 DIAGNOSIS — I150 Renovascular hypertension: Secondary | ICD-10-CM

## 2022-01-28 DIAGNOSIS — E785 Hyperlipidemia, unspecified: Secondary | ICD-10-CM | POA: Diagnosis not present

## 2022-01-28 DIAGNOSIS — I701 Atherosclerosis of renal artery: Secondary | ICD-10-CM

## 2022-01-28 DIAGNOSIS — N1832 Chronic kidney disease, stage 3b: Secondary | ICD-10-CM

## 2022-01-28 NOTE — Progress Notes (Signed)
MRN : [id number]  Jacqueline Parks is a 51 y.o. (06-Aug-1970) female who presents with chief complaint of  Chief Complaint  Patient presents with   Follow-up    Ultrasound follow up  .  History of Present Illness: Patient returns today in follow up of her renal artery disease.  She underwent right renal artery stent placement about 10 years ago for a solitary right kidney with reintervention about 6 years ago for recurrent stenosis.  She has lost a fair amount of weight and her blood pressure actually dropped low the summer.  With this episode, her creatinine clearance dropped dramatically but has rebounded and she is back up to around 40 which is roughly the norm for her with her stable chronic kidney disease for many years now.  Her renal artery duplex today shows some increase in the velocities in the right renal artery stent but this does not appear to be in the greater than 60% range by duplex criteria.  Current Outpatient Medications  Medication Sig Dispense Refill   acetaminophen (TYLENOL) 500 MG tablet Take by mouth.     acetaminophen-codeine (TYLENOL #3) 300-30 MG tablet Take 1 tablet by mouth every 4 (four) hours as needed.     aspirin EC 81 MG tablet Take 81 mg by mouth daily. Swallow whole.     carvedilol (COREG) 25 MG tablet Take 25 mg by mouth 2 (two) times daily.      furosemide (LASIX) 40 MG tablet Take 40 mg by mouth daily.     lisinopril (PRINIVIL,ZESTRIL) 40 MG tablet Take 40 mg by mouth every evening.      loratadine (CLARITIN) 10 MG tablet Take 10 mg by mouth daily. In am.  11   allopurinol (ZYLOPRIM) 100 MG tablet allopurinol 100 mg tablet  daily in morning (Patient not taking: Reported on 01/28/2022)     amLODipine (NORVASC) 10 MG tablet Take 10 mg by mouth daily. In am. (Patient not taking: Reported on 01/28/2022)     amLODipine (NORVASC) 10 MG tablet Take 10 mg by mouth daily. (Patient not taking: Reported on 01/28/2022)     atorvastatin (LIPITOR) 80 MG tablet Take  by mouth.     chlorthalidone (HYGROTON) 25 MG tablet Take 25 mg by mouth daily. (Patient not taking: Reported on 01/28/2022)  1   clopidogrel (PLAVIX) 75 MG tablet Take 1 tablet (75 mg total) by mouth daily. (Patient not taking: Reported on 01/28/2022) 30 tablet 11   diclofenac sodium (VOLTAREN) 1 % GEL Apply topically. (Patient not taking: Reported on 01/28/2022)     famotidine (PEPCID) 40 MG tablet Take 40 mg by mouth at bedtime. (Patient not taking: Reported on 01/28/2022)     famotidine (PEPCID) 40 MG tablet famotidine 40 mg tablet  TAKE 1 TABLET BY MOUTH EVERY NIGHT AT BEDTIME (Patient not taking: Reported on 01/28/2022)     Fluticasone-Salmeterol (ADVAIR) 250-50 MCG/DOSE AEPB Inhale 1 puff into the lungs 2 (two) times daily. (Patient not taking: Reported on 01/28/2022)     gabapentin (NEURONTIN) 100 MG capsule Take 200 mg by mouth at bedtime. (Patient not taking: Reported on 01/28/2022)     hydrALAZINE (APRESOLINE) 25 MG tablet Take 25 mg by mouth 2 (two) times daily. (Patient not taking: Reported on 01/28/2022)  2   nitroGLYCERIN (NITROSTAT) 0.4 MG SL tablet PLACE 1 TABLET UNDER TONGUE EVERY 5 MINUTES AS  NEEDED FOR CHEST PAIN. MAY REPEAT UP TO 3 TIMES THEN CALL 911. (Patient not taking: Reported on 01/28/2022)  1   omeprazole (PRILOSEC) 40 MG capsule Take by mouth. (Patient not taking: Reported on 01/28/2022)     oxybutynin (DITROPAN-XL) 10 MG 24 hr tablet Take 10 mg by mouth daily. (Patient not taking: Reported on 01/28/2022)     phentermine 15 MG capsule Take by mouth.     PROAIR HFA 108 (90 Base) MCG/ACT inhaler Inhale 1-2 puffs into the lungs every 6 (six) hours as needed for wheezing or shortness of breath. (Patient not taking: Reported on 01/28/2022)     Pyridoxine HCl (B-6 PO) Take 800 mg by mouth. (Patient not taking: Reported on 01/28/2022)     ranitidine (ZANTAC) 150 MG tablet Take by mouth. (Patient not taking: Reported on 07/26/2021)     spironolactone (ALDACTONE) 100 MG tablet  Take 100 mg by mouth daily. In am. (Patient not taking: Reported on 01/28/2022)     spironolactone (ALDACTONE) 25 MG tablet Take 25 mg by mouth daily. (Patient not taking: Reported on 01/28/2022)     Tiotropium Bromide Monohydrate (SPIRIVA RESPIMAT) 1.25 MCG/ACT AERS Spiriva Respimat 2.5 mcg/actuation solution for inhalation  Inhale 2 puffs every day by inhalation route. (Patient not taking: Reported on 01/28/2022)     Tiotropium Bromide Monohydrate 2.5 MCG/ACT AERS Inhale into the lungs. (Patient not taking: Reported on 01/28/2022)     topiramate (TOPAMAX) 25 MG tablet Take by mouth.     vitamin B-12 (CYANOCOBALAMIN) 1000 MCG tablet Take 1,000 mcg by mouth daily. (Patient not taking: Reported on 01/28/2022)     Vitamin D, Ergocalciferol, (DRISDOL) 50000 units CAPS capsule Take by mouth. (Patient not taking: Reported on 01/28/2022)     No current facility-administered medications for this visit.    Past Medical History:  Diagnosis Date   Arthritis    Asthma    Chronic kidney disease    Hypertension    Kidney failure    Myocardial infarction [facility])     Past Surgical History:  Procedure Laterality Date   ABDOMINAL HYSTERECTOMY     APPENDECTOMY     CESAREAN SECTION     PERIPHERAL VASCULAR CATHETERIZATION N/A 02/08/2016   Procedure: Renal Angiography;  Surgeon: Annice Needy, MD;  Location: [facility] INVASIVE CV LAB;  Service: Cardiovascular;  Laterality: N/A;   RENAL ARTERY STENT Right      Social History   Tobacco Use   Smoking status: Former   Smokeless tobacco: Never  Substance Use Topics   Alcohol use: No   Drug use: No      Family History  Problem Relation Age of Onset   COPD Mother    Heart disease Father      Allergies  Allergen Reactions   Nitroglycerin Other (See Comments)    Lowers BP Other reaction(s): bp drops   Albuterol Palpitations and Other (See Comments)    Tolerates PROAIR Inhaler (patient is allergic to BRAND NAME PROVENTIL DUE TO BLEND) Other  reaction(s): heart palpatations   Soap Rash    Dial soap causes a blistery rash.    REVIEW OF SYSTEMS (Negative unless checked)   Constitutional: [x] Weight loss  [] Fever  [] Chills Cardiac: [] Chest pain   [] Chest pressure   [] Palpitations   [] Shortness of breath when laying flat   [] Shortness of breath at rest   [] Shortness of breath with exertion. Vascular:  [] Pain in legs with walking   [] Pain in legs at rest   [] Pain in legs when laying flat   [] Claudication   [] Pain in feet when walking  [] Pain in feet at  rest  [] Pain in feet when laying flat   [] History of DVT   [] Phlebitis   [x] Swelling in legs   [] Varicose veins   [] Non-healing ulcers Pulmonary:   [] Uses home oxygen   [] Productive cough   [] Hemoptysis   [] Wheeze  [] COPD   [] Asthma Neurologic:  [] Dizziness  [] Blackouts   [] Seizures   [] History of stroke   [] History of TIA  [] Aphasia   [] Temporary blindness   [] Dysphagia   [] Weakness or numbness in arms   [] Weakness or numbness in legs Musculoskeletal:  [x] Arthritis   [] Joint swelling   [] Joint pain   [] Low back pain Hematologic:  [] Easy bruising  [] Easy bleeding   [] Hypercoagulable state   [] Anemic   Gastrointestinal:  [] Blood in stool   [] Vomiting blood  [] Gastroesophageal reflux/heartburn   [] Abdominal pain Genitourinary:  [x] Chronic kidney disease   [] Difficult urination  [] Frequent urination  [] Burning with urination   [] Hematuria Skin:  [] Rashes   [] Ulcers   [] Wounds Psychological:  [] History of anxiety   []  History of major depression.   Physical Examination  BP (!) 146/87 (BP Location: Left Arm)   Pulse (!) 55   Resp 16   Wt (!) 313 lb 3.2 oz (142.1 kg)   BMI 57.29 kg/m  Gen:  WD/WN, NAD Head: [place]/AT, No temporalis wasting. Ear/Nose/Throat: Hearing grossly intact, nares w/o erythema or drainage Eyes: Conjunctiva clear. Sclera non-icteric Neck: Supple.  Trachea midline Pulmonary:  Good air movement, no use of accessory muscles.  Cardiac: RRR, no JVD Vascular:  Vessel  Right Left  Radial Palpable Palpable                    Musculoskeletal: M/S 5/5 throughout.  No deformity or atrophy.  Mild lower extremity edema. Neurologic: Sensation grossly intact in extremities.  Symmetrical.  Speech is fluent.  Psychiatric: Judgment intact, Mood & affect appropriate for pt's clinical situation. Dermatologic: No rashes or ulcers noted.  No cellulitis or open wounds.      Labs No results found for this or any previous visit (from the past 2160 hour(s)).  Radiology No results found.  Assessment/Plan Renovascular hypertension BP control much better after intervention.   CKD (chronic kidney disease) stage 3, GFR 30-59 ml/min Follows with neprholgist   Hyperlipidemia lipid control important in reducing the progression of atherosclerotic disease. Continue statin therapy  Renal artery stenosis (HCC) Her renal artery duplex today shows some increase in the velocities in the right renal artery stent but this does not appear to be in the greater than 60% range by duplex criteria.  No role for intervention.  Continue to monitor closely on 46-month intervals.  No change in medical regimen.    , MD  01/28/2022 10:58 AM    This note was created with Dragon medical transcription system.  Any errors from dictation are purely unintentional

## 2022-01-28 NOTE — Assessment & Plan Note (Signed)
Her renal artery duplex today shows some increase in the velocities in the right renal artery stent but this does not appear to be in the greater than 60% range by duplex criteria.  No role for intervention.  Continue to monitor closely on 79-month intervals.  No change in medical regimen.

## 2022-04-10 DEATH — deceased

## 2022-08-01 ENCOUNTER — Encounter (INDEPENDENT_AMBULATORY_CARE_PROVIDER_SITE_OTHER): Payer: Medicaid Other

## 2022-08-01 ENCOUNTER — Ambulatory Visit (INDEPENDENT_AMBULATORY_CARE_PROVIDER_SITE_OTHER): Payer: Medicaid Other | Admitting: Vascular Surgery

## 2023-07-28 ENCOUNTER — Encounter (INDEPENDENT_AMBULATORY_CARE_PROVIDER_SITE_OTHER): Payer: Self-pay
